# Patient Record
Sex: Female | Born: 1955
Health system: Southern US, Community
[De-identification: ages and names within clinical notes are randomized; demographics above are authoritative.]

## PROBLEM LIST (undated history)

## (undated) DIAGNOSIS — R87619 Unspecified abnormal cytological findings in specimens from cervix uteri: Secondary | ICD-10-CM

## (undated) DIAGNOSIS — M8580 Other specified disorders of bone density and structure, unspecified site: Secondary | ICD-10-CM

## (undated) DIAGNOSIS — B009 Herpesviral infection, unspecified: Secondary | ICD-10-CM

## (undated) DIAGNOSIS — E039 Hypothyroidism, unspecified: Secondary | ICD-10-CM

## (undated) HISTORY — DX: Herpesviral infection, unspecified: B00.9

## (undated) HISTORY — DX: Unspecified abnormal cytological findings in specimens from cervix uteri: R87.619

## (undated) HISTORY — PX: APPENDECTOMY: SHX54

## (undated) HISTORY — DX: Other specified disorders of bone density and structure, unspecified site: M85.80

## (undated) HISTORY — DX: Hypothyroidism, unspecified: E03.9

## (undated) HISTORY — PX: GYNECOLOGIC CRYOSURGERY: SHX857

---

## 1998-03-29 ENCOUNTER — Other Ambulatory Visit: Admission: RE | Admit: 1998-03-29 | Discharge: 1998-03-29 | Payer: Self-pay | Admitting: Internal Medicine

## 1999-11-23 ENCOUNTER — Encounter: Payer: Self-pay | Admitting: Internal Medicine

## 1999-11-23 ENCOUNTER — Encounter: Admission: RE | Admit: 1999-11-23 | Discharge: 1999-11-23 | Payer: Self-pay | Admitting: Internal Medicine

## 2000-04-11 ENCOUNTER — Other Ambulatory Visit: Admission: RE | Admit: 2000-04-11 | Discharge: 2000-04-11 | Payer: Self-pay | Admitting: Obstetrics and Gynecology

## 2000-11-29 ENCOUNTER — Encounter: Admission: RE | Admit: 2000-11-29 | Discharge: 2000-11-29 | Payer: Self-pay | Admitting: Internal Medicine

## 2000-11-29 ENCOUNTER — Encounter: Payer: Self-pay | Admitting: Internal Medicine

## 2001-04-14 ENCOUNTER — Other Ambulatory Visit: Admission: RE | Admit: 2001-04-14 | Discharge: 2001-04-14 | Payer: Self-pay | Admitting: Obstetrics and Gynecology

## 2001-12-24 ENCOUNTER — Encounter: Payer: Self-pay | Admitting: Internal Medicine

## 2001-12-24 ENCOUNTER — Encounter: Admission: RE | Admit: 2001-12-24 | Discharge: 2001-12-24 | Payer: Self-pay | Admitting: Internal Medicine

## 2002-04-16 ENCOUNTER — Other Ambulatory Visit: Admission: RE | Admit: 2002-04-16 | Discharge: 2002-04-16 | Payer: Self-pay | Admitting: Obstetrics and Gynecology

## 2003-01-08 ENCOUNTER — Encounter: Admission: RE | Admit: 2003-01-08 | Discharge: 2003-01-08 | Payer: Self-pay | Admitting: Internal Medicine

## 2003-01-08 ENCOUNTER — Encounter: Payer: Self-pay | Admitting: Internal Medicine

## 2003-04-26 ENCOUNTER — Other Ambulatory Visit: Admission: RE | Admit: 2003-04-26 | Discharge: 2003-04-26 | Payer: Self-pay | Admitting: Obstetrics and Gynecology

## 2004-01-14 ENCOUNTER — Encounter: Admission: RE | Admit: 2004-01-14 | Discharge: 2004-01-14 | Payer: Self-pay | Admitting: Internal Medicine

## 2004-05-19 ENCOUNTER — Other Ambulatory Visit: Admission: RE | Admit: 2004-05-19 | Discharge: 2004-05-19 | Payer: Self-pay | Admitting: Obstetrics and Gynecology

## 2005-02-22 ENCOUNTER — Encounter: Admission: RE | Admit: 2005-02-22 | Discharge: 2005-02-22 | Payer: Self-pay | Admitting: Internal Medicine

## 2005-06-25 ENCOUNTER — Other Ambulatory Visit: Admission: RE | Admit: 2005-06-25 | Discharge: 2005-06-25 | Payer: Self-pay | Admitting: Obstetrics and Gynecology

## 2006-03-20 ENCOUNTER — Encounter: Admission: RE | Admit: 2006-03-20 | Discharge: 2006-03-20 | Payer: Self-pay | Admitting: Internal Medicine

## 2006-06-26 ENCOUNTER — Other Ambulatory Visit: Admission: RE | Admit: 2006-06-26 | Discharge: 2006-06-26 | Payer: Self-pay | Admitting: Obstetrics and Gynecology

## 2007-04-16 ENCOUNTER — Encounter: Admission: RE | Admit: 2007-04-16 | Discharge: 2007-04-16 | Payer: Self-pay | Admitting: Internal Medicine

## 2007-07-01 ENCOUNTER — Other Ambulatory Visit: Admission: RE | Admit: 2007-07-01 | Discharge: 2007-07-01 | Payer: Self-pay | Admitting: Obstetrics and Gynecology

## 2008-04-29 ENCOUNTER — Encounter: Admission: RE | Admit: 2008-04-29 | Discharge: 2008-04-29 | Payer: Self-pay | Admitting: Internal Medicine

## 2008-07-26 ENCOUNTER — Other Ambulatory Visit: Admission: RE | Admit: 2008-07-26 | Discharge: 2008-07-26 | Payer: Self-pay | Admitting: Obstetrics and Gynecology

## 2008-07-30 ENCOUNTER — Ambulatory Visit: Payer: Self-pay | Admitting: Internal Medicine

## 2009-05-03 ENCOUNTER — Encounter: Admission: RE | Admit: 2009-05-03 | Discharge: 2009-05-03 | Payer: Self-pay | Admitting: Internal Medicine

## 2009-06-29 DIAGNOSIS — M899 Disorder of bone, unspecified: Secondary | ICD-10-CM | POA: Insufficient documentation

## 2009-07-07 ENCOUNTER — Ambulatory Visit: Payer: Self-pay | Admitting: Internal Medicine

## 2009-09-16 DIAGNOSIS — E042 Nontoxic multinodular goiter: Secondary | ICD-10-CM | POA: Insufficient documentation

## 2010-05-04 ENCOUNTER — Encounter: Admission: RE | Admit: 2010-05-04 | Discharge: 2010-05-04 | Payer: Self-pay | Admitting: Endocrinology

## 2011-04-17 ENCOUNTER — Other Ambulatory Visit: Payer: Self-pay | Admitting: Endocrinology

## 2011-04-17 DIAGNOSIS — Z1231 Encounter for screening mammogram for malignant neoplasm of breast: Secondary | ICD-10-CM

## 2011-05-11 ENCOUNTER — Ambulatory Visit
Admission: RE | Admit: 2011-05-11 | Discharge: 2011-05-11 | Disposition: A | Discharge status: Home / Self Care | Payer: PRIVATE HEALTH INSURANCE | Source: Ambulatory Visit | Attending: Endocrinology | Admitting: Endocrinology

## 2011-05-11 DIAGNOSIS — Z1231 Encounter for screening mammogram for malignant neoplasm of breast: Secondary | ICD-10-CM

## 2012-04-17 ENCOUNTER — Other Ambulatory Visit: Payer: Self-pay | Admitting: Endocrinology

## 2012-04-17 DIAGNOSIS — Z1231 Encounter for screening mammogram for malignant neoplasm of breast: Secondary | ICD-10-CM

## 2012-05-16 ENCOUNTER — Ambulatory Visit
Admission: RE | Admit: 2012-05-16 | Discharge: 2012-05-16 | Disposition: A | Discharge status: Home / Self Care | Payer: 59 | Source: Ambulatory Visit | Attending: Endocrinology | Admitting: Endocrinology

## 2012-05-16 DIAGNOSIS — Z1231 Encounter for screening mammogram for malignant neoplasm of breast: Secondary | ICD-10-CM

## 2013-04-17 ENCOUNTER — Other Ambulatory Visit: Payer: Self-pay

## 2013-04-17 DIAGNOSIS — Z1231 Encounter for screening mammogram for malignant neoplasm of breast: Secondary | ICD-10-CM

## 2013-06-03 ENCOUNTER — Ambulatory Visit
Admission: RE | Admit: 2013-06-03 | Discharge: 2013-06-03 | Disposition: A | Discharge status: Home / Self Care | Payer: 59 | Source: Ambulatory Visit

## 2013-06-03 DIAGNOSIS — Z1231 Encounter for screening mammogram for malignant neoplasm of breast: Secondary | ICD-10-CM

## 2013-09-07 ENCOUNTER — Encounter: Payer: Self-pay | Admitting: Nurse Practitioner

## 2013-09-07 ENCOUNTER — Ambulatory Visit (INDEPENDENT_AMBULATORY_CARE_PROVIDER_SITE_OTHER): Payer: 59 | Admitting: Nurse Practitioner

## 2013-09-07 ENCOUNTER — Encounter: Payer: Self-pay | Admitting: Obstetrics and Gynecology

## 2013-09-07 VITALS — BP 120/74 | HR 76 | Resp 16 | Ht 63.5 in | Wt 122.0 lb

## 2013-09-07 DIAGNOSIS — M899 Disorder of bone, unspecified: Secondary | ICD-10-CM

## 2013-09-07 DIAGNOSIS — M8580 Other specified disorders of bone density and structure, unspecified site: Secondary | ICD-10-CM

## 2013-09-07 DIAGNOSIS — Z01419 Encounter for gynecological examination (general) (routine) without abnormal findings: Secondary | ICD-10-CM

## 2013-09-07 DIAGNOSIS — Z0000 Encounter for general adult medical examination without abnormal findings: Secondary | ICD-10-CM

## 2013-09-07 LAB — POCT URINALYSIS DIPSTICK
Bilirubin, UA: NEGATIVE
Glucose, UA: NEGATIVE
pH, UA: 7

## 2013-09-07 MED ORDER — ESTRADIOL 0.5 MG PO TABS: 0.5000 mg | ORAL_TABLET | Freq: Every day | ORAL | Status: DC

## 2013-09-07 MED ORDER — MEDROXYPROGESTERONE ACETATE 2.5 MG PO TABS: 2.5000 mg | ORAL_TABLET | Freq: Every day | ORAL | Status: DC

## 2013-09-07 NOTE — Progress Notes (Signed)
Patient ID: Shelley Drake, female   DOB: 24-Apr-1956, 57 y.o.   MRN: [id number] 57 y.o. G0P0 Divorced Caucasian Fe here for annual exam. Still wants to continue with HRT.  Same partner for over 20 years.  He has been quite ill this past several years.    No LMP recorded. Patient is postmenopausal.          Sexually active: yes  The current method of family planning is condoms all of the time.    Exercising: yes  walking, biking, aerobics.  Does some sort of exercise at least 5 days per week. Smoker:  no  Health Maintenance: Pap: 09/03/12, WNL, neg HR HPV MMG: 06/03/13, Bi-Rads 1: negative Colonoscopy: 2006, repeat in 10 years  BMD: 08/2011;  T score: -0.4/spine only TDaP: 07/2009 Labs: HB: 13.9 Urine: negative   reports that she has never smoked. She has never used smokeless tobacco. She reports that she drinks alcohol. She reports that she does not use illicit drugs.  Past Medical History  Diagnosis Date  . Osteopenia   . HSV infection   . Hypothyroid age 27    Past Surgical History  Procedure Laterality Date  . Appendectomy  age 63    Current Outpatient Prescriptions  Medication Sig Dispense Refill  . estradiol (ESTRACE) 0.5 MG tablet Take 1 tablet (0.5 mg total) by mouth daily.  90 tablet  3  . levothyroxine (SYNTHROID, LEVOTHROID) 137 MCG tablet Take 137 mcg by mouth daily before breakfast.      . medroxyPROGESTERone (PROVERA) 2.5 MG tablet Take 1 tablet (2.5 mg total) by mouth daily.  90 tablet  3  . Multiple Vitamin (MULTIVITAMIN) tablet Take 1 tablet by mouth daily.       No current facility-administered medications for this visit.    Family History  Problem Relation Age of Onset  . Ovarian cancer Mother 49  . Esophageal cancer Father 1    ROS:  Pertinent items are noted in HPI.  Otherwise, a comprehensive ROS was negative.  Exam:   BP 120/74  Pulse 76  Resp 16  Ht 5' 3.5" (1.613 m)  Wt 122 lb (55.339 kg)  BMI 21.27 kg/m2 Height: 5' 3.5" (161.3 cm)  Ht  Readings from Last 3 Encounters:  09/07/13 5' 3.5" (1.613 m)    General appearance: alert, cooperative and appears stated age Head: Normocephalic, without obvious abnormality, atraumatic Neck: no adenopathy, supple, symmetrical, trachea midline and thyroid normal to inspection and palpation Lungs: clear to auscultation bilaterally Breasts: normal appearance, no masses or tenderness Heart: regular rate and rhythm Abdomen: soft, non-tender; no masses,  no organomegaly Extremities: extremities normal, atraumatic, no cyanosis or edema Skin: Skin color, texture, turgor normal. No rashes or lesions Lymph nodes: Cervical, supraclavicular, and axillary nodes normal. No abnormal inguinal nodes palpated Neurologic: Grossly normal   Pelvic: External genitalia:  no lesions              Urethra:  normal appearing urethra with no masses, tenderness or lesions              Bartholin's and Skene's: normal                 Vagina: normal appearing vagina with normal color and discharge, no lesions              Cervix: anteverted              Pap taken: no Bimanual Exam:  Uterus:  normal size, contour, position,  consistency, mobility, non-tender              Adnexa: no mass, fullness, tenderness               Rectovaginal: Confirms               Anus:  normal sphincter tone, no lesions  A:  Well Woman with normal exam  Postmenopausal on HRT since 10/08  History of Osteopenia diagnosed 1997 - BMD normal since  history of Hypothyroid - followed by PCP  P:   Pap smear as per guidelines not done  Mammo is due 05/2014  Refill on provera 2.5 mg daily for a year  Refill on Estradiol 0.5 mg daily for a year  Reviewed potential side effects and risk of HRT including CVA, DVT, cancer, etc Will continue to follow with Mammo, diet , exercise, calcium, Vit D  return annually or prn  An After Visit Summary was printed and given to the patient.

## 2013-09-07 NOTE — Patient Instructions (Signed)

## 2013-09-08 ENCOUNTER — Encounter: Payer: Self-pay | Admitting: Nurse Practitioner

## 2013-09-09 NOTE — Progress Notes (Signed)
Encounter reviewed by Dr. Takiya Belmares Silva.  

## 2014-05-07 ENCOUNTER — Other Ambulatory Visit: Payer: Self-pay

## 2014-05-07 DIAGNOSIS — Z1231 Encounter for screening mammogram for malignant neoplasm of breast: Secondary | ICD-10-CM

## 2014-06-04 ENCOUNTER — Ambulatory Visit
Admission: RE | Admit: 2014-06-04 | Discharge: 2014-06-04 | Disposition: A | Discharge status: Home / Self Care | Payer: 59 | Source: Ambulatory Visit

## 2014-06-04 DIAGNOSIS — Z1231 Encounter for screening mammogram for malignant neoplasm of breast: Secondary | ICD-10-CM

## 2014-09-01 ENCOUNTER — Other Ambulatory Visit: Payer: Self-pay

## 2014-09-01 NOTE — Telephone Encounter (Signed)
Incoming Refill Request from [facility] JO:[id number] 2.5mg   Last AEX:09/07/13 Last Refill:09/07/13 #90 X 3 Next AEX: 09/08/14 Last MMG:06/04/14 Bi-Rads Neg   Pt has an AEX on 09/08/14. Would you like to wait till appt before refills?

## 2014-09-02 ENCOUNTER — Encounter: Payer: Self-pay | Admitting: Nurse Practitioner

## 2014-09-02 MED ORDER — MEDROXYPROGESTERONE ACETATE 2.5 MG PO TABS: 2.5000 mg | ORAL_TABLET | Freq: Every day | ORAL | Status: DC

## 2014-09-02 NOTE — Telephone Encounter (Addendum)
90 day supply sent in by Regina Eck, CNM  Routing to provider for final review

## 2014-09-02 NOTE — Telephone Encounter (Signed)
Patient sent mychart message to Milford Cage, FNP today regarding refill of medication. Please review message from patient below and advise.  From  Shelley Drake   To  Milford Cage, FNP   Sent  09/02/2014 10:12 AM      I do not have enough medroxyprogesterone 2.5mg  to last to my appointment next Wednesday 09/08/14 at 3:45. The pharmacy at [facility] on [address] says they contacted your office on Tuesday. I will be out of Medicine by Sunday.Hope you get this in time

## 2014-09-02 NOTE — Telephone Encounter (Signed)
Regina Eck CNM refilled medication for Provera 2.5 mg #90 0RF. See refill encounter. Mychart message sent to patient to inform of refill.   Routing to provider for final review. Patient agreeable to disposition. Will close encounter

## 2014-09-02 NOTE — Telephone Encounter (Signed)
Hold until appt. Unless she is totally out

## 2014-09-08 ENCOUNTER — Ambulatory Visit (INDEPENDENT_AMBULATORY_CARE_PROVIDER_SITE_OTHER): Payer: 59 | Admitting: Nurse Practitioner

## 2014-09-08 ENCOUNTER — Encounter: Payer: Self-pay | Admitting: Nurse Practitioner

## 2014-09-08 VITALS — BP 110/72 | HR 84 | Ht 64.0 in | Wt 121.0 lb

## 2014-09-08 DIAGNOSIS — Z0000 Encounter for general adult medical examination without abnormal findings: Secondary | ICD-10-CM

## 2014-09-08 DIAGNOSIS — Z1211 Encounter for screening for malignant neoplasm of colon: Secondary | ICD-10-CM

## 2014-09-08 DIAGNOSIS — Z01419 Encounter for gynecological examination (general) (routine) without abnormal findings: Secondary | ICD-10-CM

## 2014-09-08 DIAGNOSIS — R319 Hematuria, unspecified: Secondary | ICD-10-CM

## 2014-09-08 LAB — POCT URINALYSIS DIPSTICK
BILIRUBIN UA: NEGATIVE
GLUCOSE UA: NEGATIVE
Ketones, UA: NEGATIVE
LEUKOCYTES UA: NEGATIVE
NITRITE UA: NEGATIVE
PROTEIN UA: NEGATIVE
UROBILINOGEN UA: NEGATIVE
pH, UA: 5

## 2014-09-08 LAB — HEMOGLOBIN, FINGERSTICK: HEMOGLOBIN, FINGERSTICK: 13.6 g/dL (ref 12.0–16.0)

## 2014-09-08 MED ORDER — MEDROXYPROGESTERONE ACETATE 2.5 MG PO TABS: 2.5000 mg | ORAL_TABLET | Freq: Every day | ORAL | Status: DC

## 2014-09-08 MED ORDER — ESTRADIOL 0.5 MG PO TABS: 0.5000 mg | ORAL_TABLET | Freq: Every day | ORAL | Status: DC

## 2014-09-08 NOTE — Patient Instructions (Signed)

## 2014-09-08 NOTE — Progress Notes (Signed)
Patient ID: Shelley Drake, female   DOB: Sep 15, 1956, 58 y.o.   MRN: [id number] 58 y.o. G0P0 Divorced Caucasian Fe here for annual exam. No vaso symptoms. wants to continue HRT. Partner of 20 years has passed in 01-23-23.  She has been sad but coping with things now.  He had cancer for 6 years.  Patient's last menstrual period was 01/23/2007.          Sexually active: no The current method of family planning is none. Exercising: yes walking, jazzercise. Does some sort of exercise at least 5 days per week. Smoker: no  Health Maintenance: Pap: 09/03/12, WNL, neg HR HPV MMG: 06/04/14, 3D, Bi-Rads 1: negative Colonoscopy: 2006, repeat in 10 years  BMD: 08/2011; T score: -0.4/spine only TDaP: 07/2009 Shingles: no Prevnar: no Labs:  HB:  13.6  Urine:  Small RBC   reports that she has never smoked. She has never used smokeless tobacco. She reports that she drinks alcohol. She reports that she does not use illicit drugs.  Past Medical History  Diagnosis Date  . Osteopenia   . HSV infection   . Hypothyroid age 43    Past Surgical History  Procedure Laterality Date  . Appendectomy  age 56    Current Outpatient Prescriptions  Medication Sig Dispense Refill  . calcium-vitamin D 250-100 MG-UNIT per tablet Take 1 tablet by mouth daily.    Marland Kitchen estradiol (ESTRACE) 0.5 MG tablet Take 1 tablet (0.5 mg total) by mouth daily. 90 tablet 3  . levothyroxine (SYNTHROID, LEVOTHROID) 137 MCG tablet Take 137 mcg by mouth daily before breakfast.    . medroxyPROGESTERone (PROVERA) 2.5 MG tablet Take 1 tablet (2.5 mg total) by mouth daily. 90 tablet 3  . Multiple Vitamin (MULTIVITAMIN) tablet Take 1 tablet by mouth daily.     No current facility-administered medications for this visit.    Family History  Problem Relation Age of Onset  . Ovarian cancer Mother 69  . Esophageal cancer Father 74    ROS:  Pertinent items are noted in HPI.  Otherwise, a comprehensive ROS was negative.  Exam:   BP  110/72 mmHg  Pulse 84  Ht 5\' 4"  (1.626 m)  Wt 121 lb (54.885 kg)  BMI 20.76 kg/m2  LMP 01/23/2007 Height: 5\' 4"  (162.6 cm)  Ht Readings from Last 3 Encounters:  09/08/14 5\' 4"  (1.626 m)  09/07/13 5' 3.5" (1.613 m)    General appearance: alert, cooperative and appears stated age Head: Normocephalic, without obvious abnormality, atraumatic Neck: no adenopathy, supple, symmetrical, trachea midline and thyroid normal to inspection and palpation Lungs: clear to auscultation bilaterally Breasts: normal appearance, no masses or tenderness Heart: regular rate and rhythm Abdomen: soft, non-tender; no masses,  no organomegaly Extremities: extremities normal, atraumatic, no cyanosis or edema Skin: Skin color, texture, turgor normal. No rashes or lesions Lymph nodes: Cervical, supraclavicular, and axillary nodes normal. No abnormal inguinal nodes palpated Neurologic: Grossly normal   Pelvic: External genitalia:  no lesions              Urethra:  normal appearing urethra with no masses, tenderness or lesions              Bartholin's and Skene's: normal                 Vagina: normal appearing vagina with normal color and discharge, no lesions              Cervix: anteverted  Pap taken: No. Bimanual Exam:  Uterus:  normal size, contour, position, consistency, mobility, non-tender              Adnexa: no mass, fullness, tenderness               Rectovaginal: Confirms               Anus:  normal sphincter tone, no lesions  A:  Well Woman with normal exam  Postmenopausal on HRT since 10/08 History of Osteopenia diagnosed 1997 - BMD normal since History of Hypothyroid - followed by PCP  Grief reaction  hematuria  P:   Reviewed health and wellness pertinent to exam  Pap smear taken today  Mammogram is due 8/16  Follow with labs and urine  Refill on Estrace and Provera for a year.  Counseled with risk of DVT, CVA, cancer, etc.  Counseled on breast self  exam, mammography screening, HRT,adequate intake of calcium and vitamin D, diet and exercise return annually or prn  An After Visit Summary was printed and given to the patient.

## 2014-09-09 LAB — CBC WITH DIFFERENTIAL/PLATELET
BASOS ABS: 0.1 10*3/uL (ref 0.0–0.1)
BASOS PCT: 1 % (ref 0–1)
EOS ABS: 0.2 10*3/uL (ref 0.0–0.7)
Eosinophils Relative: 2 % (ref 0–5)
HCT: 41.1 % (ref 36.0–46.0)
Hemoglobin: 13.9 g/dL (ref 12.0–15.0)
LYMPHS PCT: 32 % (ref 12–46)
Lymphs Abs: 2.5 10*3/uL (ref 0.7–4.0)
MCH: 31.2 pg (ref 26.0–34.0)
MCHC: 33.8 g/dL (ref 30.0–36.0)
MCV: 92.2 fL (ref 78.0–100.0)
MONO ABS: 1 10*3/uL (ref 0.1–1.0)
Monocytes Relative: 13 % — ABNORMAL HIGH (ref 3–12)
NEUTROS PCT: 52 % (ref 43–77)
Neutro Abs: 4.1 10*3/uL (ref 1.7–7.7)
Platelets: 254 10*3/uL (ref 150–400)
RBC: 4.46 MIL/uL (ref 3.87–5.11)
RDW: 13.1 % (ref 11.5–15.5)
WBC: 7.9 10*3/uL (ref 4.0–10.5)

## 2014-09-09 LAB — COMPREHENSIVE METABOLIC PANEL
ALT: 12 U/L (ref 0–35)
AST: 15 U/L (ref 0–37)
Albumin: 4.2 g/dL (ref 3.5–5.2)
Alkaline Phosphatase: 52 U/L (ref 39–117)
BUN: 17 mg/dL (ref 6–23)
CO2: 28 mEq/L (ref 19–32)
Calcium: 9.4 mg/dL (ref 8.4–10.5)
Chloride: 103 mEq/L (ref 96–112)
Creat: 0.77 mg/dL (ref 0.50–1.10)
GLUCOSE: 94 mg/dL (ref 70–99)
POTASSIUM: 4.1 meq/L (ref 3.5–5.3)
Sodium: 139 mEq/L (ref 135–145)
Total Bilirubin: 0.3 mg/dL (ref 0.2–1.2)
Total Protein: 6.8 g/dL (ref 6.0–8.3)

## 2014-09-09 LAB — LIPID PANEL
Cholesterol: 161 mg/dL (ref 0–200)
HDL: 81 mg/dL (ref >39)
LDL Cholesterol: 65 mg/dL (ref 0–99)
TRIGLYCERIDES: 73 mg/dL (ref <150)
Total CHOL/HDL Ratio: 2 Ratio
VLDL: 15 mg/dL (ref 0–40)

## 2014-09-09 LAB — URINALYSIS, MICROSCOPIC ONLY
BACTERIA UA: NONE SEEN
Casts: NONE SEEN
Crystals: NONE SEEN
SQUAMOUS EPITHELIAL / LPF: NONE SEEN

## 2014-09-09 LAB — VITAMIN D 25 HYDROXY (VIT D DEFICIENCY, FRACTURES): Vit D, 25-Hydroxy: 40 ng/mL (ref 30–89)

## 2014-09-09 NOTE — Progress Notes (Signed)
Encounter reviewed by Dr. Brook Silva.  

## 2014-09-10 LAB — URINE CULTURE
COLONY COUNT: NO GROWTH
Organism ID, Bacteria: NO GROWTH

## 2014-09-30 LAB — FECAL OCCULT BLOOD, IMMUNOCHEMICAL: IMMUNOLOGICAL FECAL OCCULT BLOOD TEST: NEGATIVE

## 2014-09-30 NOTE — Addendum Note (Signed)
Addended by: Graylon Good on: 09/30/2014 04:19 PM   Modules accepted: Orders

## 2015-05-24 ENCOUNTER — Other Ambulatory Visit: Payer: Self-pay

## 2015-05-24 DIAGNOSIS — Z1231 Encounter for screening mammogram for malignant neoplasm of breast: Secondary | ICD-10-CM

## 2015-07-01 ENCOUNTER — Ambulatory Visit
Admission: RE | Admit: 2015-07-01 | Discharge: 2015-07-01 | Disposition: A | Discharge status: Home / Self Care | Payer: 59 | Source: Ambulatory Visit

## 2015-07-01 DIAGNOSIS — Z1231 Encounter for screening mammogram for malignant neoplasm of breast: Secondary | ICD-10-CM

## 2015-08-26 LAB — HM COLONOSCOPY: HM COLON: NORMAL

## 2015-08-29 ENCOUNTER — Other Ambulatory Visit: Payer: Self-pay | Admitting: Nurse Practitioner

## 2015-08-29 NOTE — Telephone Encounter (Signed)
Medication refill request:estradiol  Last AEX:  09-08-14 Next AEX: 09-13-15 Last MMG (if hormonal medication request):07-06-15 WNL Refill authorized: please advise

## 2015-09-13 ENCOUNTER — Ambulatory Visit (INDEPENDENT_AMBULATORY_CARE_PROVIDER_SITE_OTHER): Payer: 59 | Admitting: Nurse Practitioner

## 2015-09-13 ENCOUNTER — Encounter: Payer: Self-pay | Admitting: Nurse Practitioner

## 2015-09-13 VITALS — BP 116/80 | HR 76 | Ht 63.75 in | Wt 124.0 lb

## 2015-09-13 DIAGNOSIS — Z0000 Encounter for general adult medical examination without abnormal findings: Secondary | ICD-10-CM | POA: Diagnosis not present

## 2015-09-13 DIAGNOSIS — Z01419 Encounter for gynecological examination (general) (routine) without abnormal findings: Secondary | ICD-10-CM | POA: Diagnosis not present

## 2015-09-13 LAB — POCT URINALYSIS DIPSTICK
BILIRUBIN UA: NEGATIVE
GLUCOSE UA: NEGATIVE
Ketones, UA: NEGATIVE
LEUKOCYTES UA: NEGATIVE
NITRITE UA: NEGATIVE
PH UA: 5
Protein, UA: NEGATIVE
RBC UA: NEGATIVE
UROBILINOGEN UA: NEGATIVE

## 2015-09-13 LAB — HEPATITIS C ANTIBODY: HCV Ab: NEGATIVE

## 2015-09-13 MED ORDER — MEDROXYPROGESTERONE ACETATE 2.5 MG PO TABS: 2.5000 mg | ORAL_TABLET | Freq: Every day | ORAL | Status: DC

## 2015-09-13 MED ORDER — ESTRADIOL 0.5 MG PO TABS: ORAL_TABLET | ORAL | Status: DC

## 2015-09-13 NOTE — Progress Notes (Signed)
Patient ID: Shelley Drake, female   DOB: [birth year], 59 y.o.   MRN: [id number] 59 y.o. G0P0 Divorced  Caucasian Fe here for annual exam.  Feels well no new diagnosis.  She is now dating a new partner for about 8-10 months.  Previous partner of 20 years died 2 years ago.  Patient's last menstrual period was 12/28/2006.          Sexually active: Yes.    The current method of family planning is post menopausal status.    Exercising: Yes.    walking at least two times per week, swimming in the summer Smoker:  no  Health Maintenance: Pap: 09/03/12, WNL, neg HR HPV MMG: 07/01/15, Bi-Rads 1: Negative  Colonoscopy: 07/2015, normal, repeat in 10 years, Dr. Penelope Coop BMD: 08/2011; -0.4/spine only TDaP: 08/02/2009 Labs: at PCP Urine: negative   reports that she has never smoked. She has never used smokeless tobacco. She reports that she drinks alcohol. She reports that she does not use illicit drugs.  Past Medical History  Diagnosis Date  . Osteopenia   . HSV infection   . Hypothyroid age 50    Past Surgical History  Procedure Laterality Date  . Appendectomy  age 81    Current Outpatient Prescriptions  Medication Sig Dispense Refill  . calcium-vitamin D 250-100 MG-UNIT per tablet Take 1 tablet by mouth daily.    Marland Kitchen estradiol (ESTRACE) 0.5 MG tablet TAKE 1 TABLET (0.5 MG TOTAL) BY MOUTH DAILY. 90 tablet 4  . levothyroxine (SYNTHROID, LEVOTHROID) 137 MCG tablet Take 137 mcg by mouth daily before breakfast.    . medroxyPROGESTERone (PROVERA) 2.5 MG tablet Take 1 tablet (2.5 mg total) by mouth daily. 90 tablet 4  . Multiple Vitamin (MULTIVITAMIN) tablet Take 1 tablet by mouth daily.     No current facility-administered medications for this visit.    Family History  Problem Relation Age of Onset  . Ovarian cancer Mother 65  . Esophageal cancer Father 41    ROS:  Pertinent items are noted in HPI.  Otherwise, a comprehensive ROS was negative.  Exam:   BP 116/80 mmHg  Pulse 76  Ht 5'  3.75" (1.619 m)  Wt 124 lb (56.246 kg)  BMI 21.46 kg/m2  LMP 12/28/2006 Height: 5' 3.75" (161.9 cm) Ht Readings from Last 3 Encounters:  09/13/15 5' 3.75" (1.619 m)  09/08/14 5\' 4"  (1.626 m)  09/07/13 5' 3.5" (1.613 m)    General appearance: alert, cooperative and appears stated age Head: Normocephalic, without obvious abnormality, atraumatic Neck: no adenopathy, supple, symmetrical, trachea midline and thyroid normal to inspection and palpation Lungs: clear to auscultation bilaterally Breasts: normal appearance, no masses or tenderness Heart: regular rate and rhythm Abdomen: soft, non-tender; no masses,  no organomegaly Extremities: extremities normal, atraumatic, no cyanosis or edema Skin: Skin color, texture, turgor normal. No rashes or lesions Lymph nodes: Cervical, supraclavicular, and axillary nodes normal. No abnormal inguinal nodes palpated Neurologic: Grossly normal   Pelvic: External genitalia:  no lesions              Urethra:  normal appearing urethra with no masses, tenderness or lesions              Bartholin's and Skene's: normal                 Vagina: normal appearing vagina with normal color and discharge, no lesions              Cervix: anteverted  Pap taken: Yes.   Bimanual Exam:  Uterus:  normal size, contour, position, consistency, mobility, non-tender              Adnexa: no mass, fullness, tenderness               Rectovaginal: Confirms               Anus:  normal sphincter tone, no lesions  Chaperone present: yes  A:  Well Woman with normal exam  Postmenopausal on HRT since 10/08 History of Osteopenia diagnosed 1997 - BMD normal since History of Hypothyroid - followed by PCP    P:   Reviewed health and wellness pertinent to exam  Pap smear as above  Mammogram is due 06/2016  Refill on Estrace and Provera for a year  Counseled with risk of DVT, CVA, cancer  Will update health maintenance  Counseled  on breast self exam, mammography screening, use and side effects of HRT, adequate intake of calcium and vitamin D, diet and exercise return annually or prn  An After Visit Summary was printed and given to the patient.

## 2015-09-13 NOTE — Patient Instructions (Signed)

## 2015-09-14 LAB — HIV ANTIBODY (ROUTINE TESTING W REFLEX): HIV: NONREACTIVE

## 2015-09-18 NOTE — Progress Notes (Signed)
Encounter reviewed by Dr. Brook Amundson C. Silva.  

## 2015-09-19 LAB — IPS PAP TEST WITH HPV

## 2015-11-18 MED FILL — ESTRADIOL 0.5 MG TABLET: 0.5 | 90 days supply | Qty: 90 | Fill #0

## 2015-11-18 MED FILL — MEDROXYPROGESTERONE 2.5 MG: 2.5 | 90 days supply | Qty: 90 | Fill #0

## 2015-11-21 DIAGNOSIS — Z6821 Body mass index (BMI) 21.0-21.9, adult: Secondary | ICD-10-CM | POA: Diagnosis not present

## 2015-11-21 DIAGNOSIS — Z1389 Encounter for screening for other disorder: Secondary | ICD-10-CM | POA: Diagnosis not present

## 2015-11-21 DIAGNOSIS — E042 Nontoxic multinodular goiter: Secondary | ICD-10-CM | POA: Diagnosis not present

## 2015-11-21 DIAGNOSIS — M859 Disorder of bone density and structure, unspecified: Secondary | ICD-10-CM | POA: Diagnosis not present

## 2015-11-24 DIAGNOSIS — H524 Presbyopia: Secondary | ICD-10-CM | POA: Diagnosis not present

## 2015-12-09 DIAGNOSIS — L570 Actinic keratosis: Secondary | ICD-10-CM | POA: Diagnosis not present

## 2016-01-30 MED FILL — LEVOTHYROXINE 137 MCG TAB: 137 | 90 days supply | Qty: 90 | Fill #0

## 2016-02-28 MED FILL — MEDROXYPROGESTERONE 2.5 MG: 2.5 | 90 days supply | Qty: 90 | Fill #1

## 2016-02-28 MED FILL — ESTRADIOL 0.5 MG TABLET: 0.5 | 90 days supply | Qty: 90 | Fill #1

## 2016-05-08 MED FILL — LEVOTHYROXINE 137 MCG TAB: 137 | 90 days supply | Qty: 90 | Fill #1

## 2016-05-28 MED FILL — ESTRADIOL 0.5 MG TABLET: 0.5 | 90 days supply | Qty: 90 | Fill #2

## 2016-05-28 MED FILL — MEDROXYPROGESTERONE 2.5 MG: 2.5 | 90 days supply | Qty: 90 | Fill #2

## 2016-06-06 ENCOUNTER — Other Ambulatory Visit: Payer: Self-pay | Admitting: Endocrinology

## 2016-06-06 DIAGNOSIS — Z1231 Encounter for screening mammogram for malignant neoplasm of breast: Secondary | ICD-10-CM

## 2016-07-10 ENCOUNTER — Ambulatory Visit
Admission: RE | Admit: 2016-07-10 | Discharge: 2016-07-10 | Disposition: A | Discharge status: Home / Self Care | Payer: 59 | Source: Ambulatory Visit | Attending: Endocrinology | Admitting: Endocrinology

## 2016-07-10 DIAGNOSIS — Z1231 Encounter for screening mammogram for malignant neoplasm of breast: Secondary | ICD-10-CM

## 2016-08-21 MED FILL — LEVOTHYROXINE 137 MCG TAB: 137 | 90 days supply | Qty: 90 | Fill #2

## 2016-08-27 MED FILL — ESTRADIOL 0.5 MG TABLET: 0.5 | 90 days supply | Qty: 90 | Fill #3

## 2016-08-27 MED FILL — MEDROXYPROGESTERONE 2.5 MG: 2.5 | 90 days supply | Qty: 90 | Fill #3

## 2016-09-14 ENCOUNTER — Ambulatory Visit (INDEPENDENT_AMBULATORY_CARE_PROVIDER_SITE_OTHER): Payer: 59 | Admitting: Podiatry

## 2016-09-14 ENCOUNTER — Encounter: Payer: Self-pay | Admitting: Podiatry

## 2016-09-14 VITALS — BP 114/65 | HR 87

## 2016-09-14 DIAGNOSIS — M216X9 Other acquired deformities of unspecified foot: Secondary | ICD-10-CM

## 2016-09-14 DIAGNOSIS — Q828 Other specified congenital malformations of skin: Secondary | ICD-10-CM | POA: Diagnosis not present

## 2016-09-14 NOTE — Progress Notes (Signed)
   Subjective:    Patient ID: Shelley Drake, female    DOB: 14-May-1956, 60 y.o.   MRN: [id number]  HPI    Review of Systems  All other systems reviewed and are negative.      Objective:   Physical Exam        Assessment & Plan:

## 2016-09-17 NOTE — Progress Notes (Signed)
Subjective:     Patient ID: Shelley Drake, female   DOB: 11-20-1955, 60 y.o.   MRN: [id number]  HPI patient presents with painful lesion plantar aspect right that patient states is been present for a while and feels like she's walking on a more   Review of Systems  All other systems reviewed and are negative.      Objective:   Physical Exam  Constitutional: She is oriented to person, place, and time.  Cardiovascular: Intact distal pulses.   Musculoskeletal: Normal range of motion.  Neurological: She is oriented to person, place, and time.  Skin: Skin is warm.  Nursing note and vitals reviewed.  neurovascular status found to be intact muscle strength adequate range of motion within normal limits with patient found to have a keratotic lesion subsecond metatarsal right and distal to this area with patient noted to have a lucent core upon debridement and pain when palpated directly. Patient's found have good digital perfusion and well oriented 3     Assessment:     Probable porokeratotic type lesion with possibility for bone pressure    Plan:     H&P condition reviewed and debridement accomplished and applied medication to soften the lesion. We will see her back when symptomatic and this will dictate what else we may need to do with this particular condition

## 2016-09-19 ENCOUNTER — Ambulatory Visit (INDEPENDENT_AMBULATORY_CARE_PROVIDER_SITE_OTHER): Payer: 59 | Admitting: Nurse Practitioner

## 2016-09-19 ENCOUNTER — Encounter: Payer: Self-pay | Admitting: Nurse Practitioner

## 2016-09-19 VITALS — BP 110/70 | HR 76 | Ht 63.0 in | Wt 124.0 lb

## 2016-09-19 DIAGNOSIS — Z0000 Encounter for general adult medical examination without abnormal findings: Secondary | ICD-10-CM | POA: Diagnosis not present

## 2016-09-19 DIAGNOSIS — Z01419 Encounter for gynecological examination (general) (routine) without abnormal findings: Secondary | ICD-10-CM | POA: Diagnosis not present

## 2016-09-19 LAB — CBC
HCT: 40.5 % (ref 35.0–45.0)
Hemoglobin: 13.1 g/dL (ref 11.7–15.5)
MCH: 30.9 pg (ref 27.0–33.0)
MCHC: 32.3 g/dL (ref 32.0–36.0)
MCV: 95.5 fL (ref 80.0–100.0)
MPV: 11.2 fL (ref 7.5–12.5)
PLATELETS: 223 10*3/uL (ref 140–400)
RBC: 4.24 MIL/uL (ref 3.80–5.10)
RDW: 12.5 % (ref 11.0–15.0)
WBC: 7.6 10*3/uL (ref 3.8–10.8)

## 2016-09-19 LAB — POCT URINALYSIS DIPSTICK
Bilirubin, UA: NEGATIVE
Glucose, UA: NEGATIVE
Ketones, UA: NEGATIVE
Leukocytes, UA: NEGATIVE
NITRITE UA: NEGATIVE
PH UA: 5
Protein, UA: NEGATIVE
RBC UA: NEGATIVE
UROBILINOGEN UA: NEGATIVE

## 2016-09-19 LAB — TSH: TSH: 0.05 mIU/L — ABNORMAL LOW

## 2016-09-19 MED ORDER — ESTRADIOL 0.5 MG PO TABS: ORAL_TABLET | ORAL | 4 refills | Status: DC

## 2016-09-19 MED ORDER — MEDROXYPROGESTERONE ACETATE 2.5 MG PO TABS: 2.5000 mg | ORAL_TABLET | Freq: Every day | ORAL | 4 refills | Status: DC

## 2016-09-19 NOTE — Patient Instructions (Signed)

## 2016-09-19 NOTE — Progress Notes (Signed)
Patient ID: Shelley Drake, female   DOB: 28-May-1956, 60 y.o.   MRN: [id number]  60 y.o. G0P0000 Divorced  Caucasian Fe here for annual exam.  Right foot plantar Keratosis and treated by Podiatrist - Dr. Paulla Dolly.  No other new health problems.  Same partner now for 2 yrs.  Patient's last menstrual period was 12/28/2006.          Sexually active: Yes.    The current method of family planning is post menopausal status.    Exercising: Yes.    walking Smoker:  no  Health Maintenance: Pap: 09/13/15, Negative with neg HR HPV MMG: 07/10/16, 3D, Bi-Rads 1: Negative  Colonoscopy: 08/26/15, normal, repeat in 10 years, Dr. Penelope Coop BMD: 08/2011; -0.4/spine only Shingles: Never Pneumonia: Not indicated due to age TDaP: 08/02/2009 Hep C and HIV: 09/13/15 Labs: HB: 12.4  Urine: Negative   reports that she has never smoked. She has never used smokeless tobacco. She reports that she drinks alcohol. She reports that she does not use drugs.  Past Medical History:  Diagnosis Date  . HSV infection   . Hypothyroid age 57  . Osteopenia     Past Surgical History:  Procedure Laterality Date  . APPENDECTOMY  age 66    Current Outpatient Prescriptions  Medication Sig Dispense Refill  . calcium-vitamin D 250-100 MG-UNIT per tablet Take 1 tablet by mouth daily.    Marland Kitchen estradiol (ESTRACE) 0.5 MG tablet TAKE 1 TABLET (0.5 MG TOTAL) BY MOUTH DAILY. 90 tablet 4  . levothyroxine (SYNTHROID, LEVOTHROID) 137 MCG tablet Take 137 mcg by mouth daily before breakfast.    . medroxyPROGESTERone (PROVERA) 2.5 MG tablet Take 1 tablet (2.5 mg total) by mouth daily. 90 tablet 4  . Multiple Vitamin (MULTIVITAMIN) tablet Take 1 tablet by mouth daily.     No current facility-administered medications for this visit.     Family History  Problem Relation Age of Onset  . Ovarian cancer Mother 26  . Esophageal cancer Father 33    ROS:  Pertinent items are noted in HPI.  Otherwise, a comprehensive ROS was negative.  Exam:    LMP 12/28/2006    Ht Readings from Last 3 Encounters:  09/13/15 5' 3.75" (1.619 m)  09/08/14 5\' 4"  (1.626 m)  09/07/13 5' 3.5" (1.613 m)    General appearance: alert, cooperative and appears stated age Head: Normocephalic, without obvious abnormality, atraumatic Neck: no adenopathy, supple, symmetrical, trachea midline and thyroid normal to inspection and palpation Lungs: clear to auscultation bilaterally Breasts: normal appearance, no masses or tenderness Heart: regular rate and rhythm Abdomen: soft, non-tender; no masses,  no organomegaly Extremities: extremities normal, atraumatic, no cyanosis or edema Skin: Skin color, texture, turgor normal. No rashes or lesions Lymph nodes: Cervical, supraclavicular, and axillary nodes normal. No abnormal inguinal nodes palpated Neurologic: Grossly normal   Pelvic: External genitalia:  no lesions              Urethra:  normal appearing urethra with no masses, tenderness or lesions              Bartholin's and Skene's: normal                 Vagina: normal appearing vagina with normal color and discharge, no lesions              Cervix: anteverted              Pap taken: No. Bimanual Exam:  Uterus:  normal size, contour,  position, consistency, mobility, non-tender              Adnexa: no mass, fullness, tenderness               Rectovaginal: Confirms               Anus:  normal sphincter tone, no lesions  Chaperone present: yes  A:  Well Woman with normal exam  Postmenopausal on HRT since 10/08 History of Osteopenia diagnosed 1997 - BMD normal since History of Hypothyroid - followed by PCP   P:   Reviewed health and wellness pertinent to exam  Pap smear as above  Mammogram is due 06/2017  Refill on HRT for a year - will try tapering this next year  Counseled about risk of DVT, CVA, cancer, etc  Counseled on breast self exam, mammography screening, use and side effects of HRT, adequate intake of  calcium and vitamin D, diet and exercise return annually or prn  An After Visit Summary was printed and given to the patient.

## 2016-09-20 LAB — LIPID PANEL
CHOL/HDL RATIO: 1.8 ratio (ref <5.0)
CHOLESTEROL: 148 mg/dL (ref <200)
HDL: 81 mg/dL (ref >50)
LDL Cholesterol: 50 mg/dL (ref <100)
Triglycerides: 84 mg/dL (ref <150)
VLDL: 17 mg/dL (ref <30)

## 2016-09-20 LAB — COMPREHENSIVE METABOLIC PANEL
ALK PHOS: 46 U/L (ref 33–130)
ALT: 10 U/L (ref 6–29)
AST: 16 U/L (ref 10–35)
Albumin: 4 g/dL (ref 3.6–5.1)
BUN: 15 mg/dL (ref 7–25)
CO2: 27 mmol/L (ref 20–31)
CREATININE: 0.98 mg/dL (ref 0.50–0.99)
Calcium: 9 mg/dL (ref 8.6–10.4)
Chloride: 106 mmol/L (ref 98–110)
Glucose, Bld: 80 mg/dL (ref 65–99)
Potassium: 3.9 mmol/L (ref 3.5–5.3)
SODIUM: 142 mmol/L (ref 135–146)
TOTAL PROTEIN: 6.7 g/dL (ref 6.1–8.1)
Total Bilirubin: 0.3 mg/dL (ref 0.2–1.2)

## 2016-09-20 LAB — VITAMIN D 25 HYDROXY (VIT D DEFICIENCY, FRACTURES): VIT D 25 HYDROXY: 35 ng/mL (ref 30–100)

## 2016-09-20 NOTE — Progress Notes (Signed)
Reviewed personally.  M. Suzanne Seraphim Affinito, MD.  

## 2016-09-24 LAB — HEMOGLOBIN, FINGERSTICK: HEMOGLOBIN, FINGERSTICK: 12.4 g/dL (ref 12.0–16.0)

## 2016-10-01 MED FILL — LEVOTHYROXINE 112 MCG TAB: 112 | 90 days supply | Qty: 90 | Fill #0

## 2016-11-21 DIAGNOSIS — Z1389 Encounter for screening for other disorder: Secondary | ICD-10-CM | POA: Diagnosis not present

## 2016-11-21 DIAGNOSIS — E042 Nontoxic multinodular goiter: Secondary | ICD-10-CM | POA: Diagnosis not present

## 2016-11-23 MED FILL — MEDROXYPROGESTERONE 2.5 MG: 2.5 | 90 days supply | Qty: 90 | Fill #0

## 2016-11-23 MED FILL — ESTRADIOL 0.5 MG TABLET: 0.5 | 90 days supply | Qty: 90 | Fill #0

## 2016-12-06 DIAGNOSIS — H524 Presbyopia: Secondary | ICD-10-CM | POA: Diagnosis not present

## 2016-12-25 MED FILL — LEVOTHYROXINE 112 MCG TAB: 112 | 90 days supply | Qty: 90 | Fill #1

## 2017-01-23 DIAGNOSIS — D229 Melanocytic nevi, unspecified: Secondary | ICD-10-CM | POA: Diagnosis not present

## 2017-01-23 DIAGNOSIS — L570 Actinic keratosis: Secondary | ICD-10-CM | POA: Diagnosis not present

## 2017-01-23 DIAGNOSIS — L821 Other seborrheic keratosis: Secondary | ICD-10-CM | POA: Diagnosis not present

## 2017-02-13 MED FILL — IBUPROFEN 600 MG TABLET: 600 | 5 days supply | Qty: 20 | Fill #0

## 2017-02-27 MED FILL — ESTRADIOL 0.5 MG TABLET: 0.5 | 90 days supply | Qty: 90 | Fill #1

## 2017-02-27 MED FILL — MEDROXYPROGESTERONE 2.5 MG: 2.5 | 90 days supply | Qty: 90 | Fill #1

## 2017-03-26 MED FILL — LEVOTHYROXINE 112 MCG TAB: 112 | 90 days supply | Qty: 90 | Fill #2

## 2017-05-21 ENCOUNTER — Telehealth: Payer: Self-pay | Admitting: Obstetrics & Gynecology

## 2017-05-21 NOTE — Telephone Encounter (Signed)
Left message for pt to reschedule Shelley Drake appt.

## 2017-05-27 MED FILL — ESTRADIOL 0.5 MG TABLET: 0.5 | 90 days supply | Qty: 90 | Fill #2

## 2017-05-27 MED FILL — MEDROXYPROGESTERONE 2.5 MG: 2.5 | 90 days supply | Qty: 90 | Fill #2

## 2017-05-29 ENCOUNTER — Other Ambulatory Visit: Payer: Self-pay | Admitting: Endocrinology

## 2017-05-29 DIAGNOSIS — Z1231 Encounter for screening mammogram for malignant neoplasm of breast: Secondary | ICD-10-CM

## 2017-06-17 MED FILL — LEVOTHYROXINE 112 MCG TAB: 112 | 90 days supply | Qty: 90 | Fill #0

## 2017-07-03 DIAGNOSIS — R197 Diarrhea, unspecified: Secondary | ICD-10-CM | POA: Diagnosis not present

## 2017-07-03 DIAGNOSIS — E042 Nontoxic multinodular goiter: Secondary | ICD-10-CM | POA: Diagnosis not present

## 2017-07-03 DIAGNOSIS — Z1389 Encounter for screening for other disorder: Secondary | ICD-10-CM | POA: Diagnosis not present

## 2017-07-03 DIAGNOSIS — Z6820 Body mass index (BMI) 20.0-20.9, adult: Secondary | ICD-10-CM | POA: Diagnosis not present

## 2017-07-03 DIAGNOSIS — M859 Disorder of bone density and structure, unspecified: Secondary | ICD-10-CM | POA: Diagnosis not present

## 2017-07-09 ENCOUNTER — Other Ambulatory Visit: Payer: Self-pay | Admitting: Endocrinology

## 2017-07-09 DIAGNOSIS — R197 Diarrhea, unspecified: Secondary | ICD-10-CM

## 2017-07-12 ENCOUNTER — Ambulatory Visit: Payer: 59

## 2017-07-15 ENCOUNTER — Ambulatory Visit
Admission: RE | Admit: 2017-07-15 | Discharge: 2017-07-15 | Disposition: A | Discharge status: Home / Self Care | Payer: 59 | Source: Ambulatory Visit | Attending: Endocrinology | Admitting: Endocrinology

## 2017-07-15 DIAGNOSIS — R197 Diarrhea, unspecified: Secondary | ICD-10-CM

## 2017-07-15 MED ORDER — IOPAMIDOL (ISOVUE-300) INJECTION 61%
100.0000 mL | Freq: Once | INTRAVENOUS | Status: AC | PRN
  Administered 2017-07-15: 100 mL via INTRAVENOUS

## 2017-08-02 ENCOUNTER — Ambulatory Visit
Admission: RE | Admit: 2017-08-02 | Discharge: 2017-08-02 | Disposition: A | Discharge status: Home / Self Care | Payer: 59 | Source: Ambulatory Visit | Attending: Endocrinology | Admitting: Endocrinology

## 2017-08-02 DIAGNOSIS — Z1231 Encounter for screening mammogram for malignant neoplasm of breast: Secondary | ICD-10-CM | POA: Diagnosis not present

## 2017-08-23 DIAGNOSIS — R197 Diarrhea, unspecified: Secondary | ICD-10-CM | POA: Diagnosis not present

## 2017-08-26 MED FILL — MEDROXYPROGESTERONE 2.5 MG: 2.5 | 90 days supply | Qty: 90 | Fill #3

## 2017-08-26 MED FILL — ESTRADIOL 0.5 MG TABLET: 0.5 | 90 days supply | Qty: 90 | Fill #3

## 2017-08-27 DIAGNOSIS — R197 Diarrhea, unspecified: Secondary | ICD-10-CM | POA: Diagnosis not present

## 2017-09-16 MED FILL — LEVOTHYROXINE 112 MCG TAB: 112 | 90 days supply | Qty: 90 | Fill #1

## 2017-09-23 DIAGNOSIS — E739 Lactose intolerance, unspecified: Secondary | ICD-10-CM | POA: Diagnosis not present

## 2017-09-24 ENCOUNTER — Ambulatory Visit: Payer: 59 | Admitting: Nurse Practitioner

## 2017-10-02 ENCOUNTER — Encounter: Payer: Self-pay | Admitting: Certified Nurse Midwife

## 2017-10-02 ENCOUNTER — Other Ambulatory Visit: Payer: Self-pay

## 2017-10-02 ENCOUNTER — Ambulatory Visit (INDEPENDENT_AMBULATORY_CARE_PROVIDER_SITE_OTHER): Payer: 59 | Admitting: Certified Nurse Midwife

## 2017-10-02 VITALS — BP 108/68 | HR 64 | Resp 16 | Ht 63.0 in | Wt 121.0 lb

## 2017-10-02 DIAGNOSIS — E739 Lactose intolerance, unspecified: Secondary | ICD-10-CM | POA: Diagnosis not present

## 2017-10-02 DIAGNOSIS — Z79890 Hormone replacement therapy (postmenopausal): Secondary | ICD-10-CM | POA: Diagnosis not present

## 2017-10-02 DIAGNOSIS — N951 Menopausal and female climacteric states: Secondary | ICD-10-CM | POA: Diagnosis not present

## 2017-10-02 DIAGNOSIS — Z01419 Encounter for gynecological examination (general) (routine) without abnormal findings: Secondary | ICD-10-CM

## 2017-10-02 MED ORDER — MEDROXYPROGESTERONE ACETATE 2.5 MG PO TABS: 2.5000 mg | ORAL_TABLET | Freq: Every day | ORAL | 0 refills | Status: DC

## 2017-10-02 MED ORDER — ESTRADIOL 0.5 MG PO TABS: ORAL_TABLET | ORAL | 1 refills | Status: DC

## 2017-10-02 NOTE — Progress Notes (Signed)
61 y.o. G0P0000 Divorced  Caucasian Fe here for annual exam. Menopausal on HRT. Denies vaginal bleeding or vaginal dryness. Has been diagnosed with lactose intolerance due to diarrhea. Taking Lactaid with good success. Had CT of body and all negative, when trying to evaluate diarrhea. Sees PCP yearly for labs and hypothyroid management. All stable per patient. Plans to wean of HRT . 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 No health issues today.   Patient's last menstrual period was 12/28/2006.          Sexually active: Yes.   The current method of family planning is post menopausal status.    Exercising: Yes.    walking Smoker:  no  Health Maintenance: Pap:  09-13-15 neg HPV HR neg  History of Abnormal Pap: yes MMG:  08-02-17 category c density birads 1:neg Self Breast exams: yes Colonoscopy:  08/26/15 neg f/u 41yrs BMD:   2012 with dr Forde Dandy normal ( took Fosamax) TDaP:  2010 Shingles: not done Pneumonia: 2016 Hep C and HIV: both neg 2016 Labs: if needed   reports that  has never smoked. she has never used smokeless tobacco. She reports that she drinks about 1.2 oz of alcohol per week. She reports that she does not use drugs.  Past Medical History:  Diagnosis Date  . HSV infection   . Hypothyroid age 55  . Osteopenia     Past Surgical History:  Procedure Laterality Date  . APPENDECTOMY  age 73    Current Outpatient Medications  Medication Sig Dispense Refill  . aspirin 81 MG tablet Take 81 mg by mouth daily.    . calcium-vitamin D 250-100 MG-UNIT per tablet Take 1 tablet by mouth daily.    Marland Kitchen estradiol (ESTRACE) 0.5 MG tablet TAKE 1 TABLET (0.5 MG TOTAL) BY MOUTH DAILY. 90 tablet 4  . levothyroxine (SYNTHROID, LEVOTHROID) 112 MCG tablet Take 112 mcg by mouth daily.  1  . medroxyPROGESTERone (PROVERA) 2.5 MG tablet Take 1 tablet (2.5 mg total) by mouth daily. 90 tablet 4  . Multiple Vitamin (MULTIVITAMIN) tablet Take 1 tablet by mouth daily.    .  Turmeric 500 MG CAPS Take 1 capsule by mouth daily.     No current facility-administered medications for this visit.     Family History  Problem Relation Age of Onset  . Ovarian cancer Mother 54  . Esophageal cancer Father 58  . Thyroid disease Brother        hyperthyroid    ROS:  Pertinent items are noted in HPI.  Otherwise, a comprehensive ROS was negative.  Exam:   BP 108/68   Pulse 64   Resp 16   Ht 5\' 3"  (1.6 m)   Wt 121 lb (54.9 kg)   LMP 12/28/2006   BMI 21.43 kg/m  Height: 5\' 3"  (160 cm) Ht Readings from Last 3 Encounters:  10/02/17 5\' 3"  (1.6 m)  09/19/16 5\' 3"  (1.6 m)  09/13/15 5' 3.75" (1.619 m)    General appearance: alert, cooperative and appears stated age Head: Normocephalic, without obvious abnormality, atraumatic Neck: no adenopathy, supple, symmetrical, trachea midline and thyroid normal to inspection and palpation Lungs: clear to auscultation bilaterally Breasts: normal appearance, no masses or tenderness, No nipple retraction or dimpling, No nipple discharge or bleeding, No axillary or supraclavicular adenopathy Heart: regular rate and rhythm Abdomen: soft, non-tender; no masses,  no organomegaly Extremities: extremities normal, atraumatic, no cyanosis or edema Skin: Skin color, texture, turgor normal. No rashes or lesions  Lymph nodes: Cervical, supraclavicular, and axillary nodes normal. No abnormal inguinal nodes palpated Neurologic: Grossly normal   Pelvic: External genitalia:  no lesions              Urethra:  normal appearing urethra with no masses, tenderness or lesions              Bartholin's and Skene's: normal                 Vagina: normal appearing vagina with normal color and discharge, no lesions              Cervix: no cervical motion tenderness and no lesions              Pap taken: No. Bimanual Exam:  Uterus:  normal size, contour, position, consistency, mobility, non-tender              Adnexa: normal adnexa and no mass,  fullness, tenderness               Rectovaginal: Confirms               Anus:  normal sphincter tone, no lesions  Chaperone present: yes  A:  Well Woman with normal exam  Menopausal on HRT,   Hypothyroid with PCP management, all stable  BMD due  Family history of Ovarian cancer  P:   Reviewed health and wellness pertinent to exam  Discussed risks/benefits/warning signs of HRT and recommend weaning of over the next month or two. Patient agreeable. Will give Rx so she can wean down. Given instructions to start on 1/2 dose of Estrace and stay the same with Provera. Will call if issues with and when she has stopped.  Rx Estrace see order with instructions  Rx Provera see order with instructions  Continue follow up with PCP as indicated  Discussed limited evaluation available for ovarian cancer prevention. Recommend genetic evaluation, patient aware and declines. Discussed Ca 125 and PUS. She had CT that was normal so no further screening at this point.  Pap smear: no   counseled on breast self exam, mammography screening, feminine hygiene, menopause, adequate intake of calcium and vitamin D, diet and exercise  return annually or prn  An After Visit Summary was printed and given to the patient.

## 2017-10-02 NOTE — Patient Instructions (Signed)

## 2017-11-19 DIAGNOSIS — M859 Disorder of bone density and structure, unspecified: Secondary | ICD-10-CM | POA: Diagnosis not present

## 2017-11-19 DIAGNOSIS — E042 Nontoxic multinodular goiter: Secondary | ICD-10-CM | POA: Diagnosis not present

## 2017-11-19 DIAGNOSIS — Z1389 Encounter for screening for other disorder: Secondary | ICD-10-CM | POA: Diagnosis not present

## 2017-11-19 DIAGNOSIS — Z6821 Body mass index (BMI) 21.0-21.9, adult: Secondary | ICD-10-CM | POA: Diagnosis not present

## 2017-12-23 DIAGNOSIS — H524 Presbyopia: Secondary | ICD-10-CM | POA: Diagnosis not present

## 2017-12-27 MED FILL — LEVOTHYROXINE 112 MCG TAB: 112 | 90 days supply | Qty: 90 | Fill #0

## 2018-01-10 DIAGNOSIS — D3704 Neoplasm of uncertain behavior of the minor salivary glands: Secondary | ICD-10-CM | POA: Diagnosis not present

## 2018-01-15 DIAGNOSIS — K1379 Other lesions of oral mucosa: Secondary | ICD-10-CM | POA: Diagnosis not present

## 2018-03-07 ENCOUNTER — Other Ambulatory Visit: Payer: Self-pay | Admitting: Nurse Practitioner

## 2018-04-01 MED FILL — LEVOTHYROXINE 112 MCG TAB: 112 | 90 days supply | Qty: 90 | Fill #1

## 2018-06-25 DIAGNOSIS — L570 Actinic keratosis: Secondary | ICD-10-CM | POA: Diagnosis not present

## 2018-06-25 DIAGNOSIS — D229 Melanocytic nevi, unspecified: Secondary | ICD-10-CM | POA: Diagnosis not present

## 2018-07-02 MED FILL — LEVOTHYROXINE 112 MCG TAB: 112 | 90 days supply | Qty: 90 | Fill #0

## 2018-07-04 ENCOUNTER — Other Ambulatory Visit: Payer: Self-pay | Admitting: Endocrinology

## 2018-07-04 DIAGNOSIS — Z1231 Encounter for screening mammogram for malignant neoplasm of breast: Secondary | ICD-10-CM

## 2018-08-08 ENCOUNTER — Ambulatory Visit
Admission: RE | Admit: 2018-08-08 | Discharge: 2018-08-08 | Disposition: A | Discharge status: Home / Self Care | Payer: 59 | Source: Ambulatory Visit | Attending: Endocrinology | Admitting: Endocrinology

## 2018-08-08 DIAGNOSIS — Z1231 Encounter for screening mammogram for malignant neoplasm of breast: Secondary | ICD-10-CM | POA: Diagnosis not present

## 2018-08-27 DIAGNOSIS — E042 Nontoxic multinodular goiter: Secondary | ICD-10-CM | POA: Diagnosis not present

## 2018-08-27 DIAGNOSIS — Z6820 Body mass index (BMI) 20.0-20.9, adult: Secondary | ICD-10-CM | POA: Diagnosis not present

## 2018-08-27 DIAGNOSIS — R61 Generalized hyperhidrosis: Secondary | ICD-10-CM | POA: Diagnosis not present

## 2018-08-27 DIAGNOSIS — M859 Disorder of bone density and structure, unspecified: Secondary | ICD-10-CM | POA: Diagnosis not present

## 2018-08-27 DIAGNOSIS — R002 Palpitations: Secondary | ICD-10-CM | POA: Diagnosis not present

## 2018-09-03 DIAGNOSIS — S0501XA Injury of conjunctiva and corneal abrasion without foreign body, right eye, initial encounter: Secondary | ICD-10-CM | POA: Diagnosis not present

## 2018-10-08 ENCOUNTER — Other Ambulatory Visit: Payer: Self-pay

## 2018-10-08 ENCOUNTER — Encounter: Payer: Self-pay | Admitting: Certified Nurse Midwife

## 2018-10-08 ENCOUNTER — Ambulatory Visit (INDEPENDENT_AMBULATORY_CARE_PROVIDER_SITE_OTHER): Payer: 59 | Admitting: Certified Nurse Midwife

## 2018-10-08 ENCOUNTER — Other Ambulatory Visit (HOSPITAL_COMMUNITY)
Admission: RE | Admit: 2018-10-08 | Discharge: 2018-10-08 | Disposition: A | Discharge status: Home / Self Care | Payer: 59 | Source: Ambulatory Visit | Attending: Certified Nurse Midwife | Admitting: Certified Nurse Midwife

## 2018-10-08 VITALS — BP 100/64 | HR 70 | Resp 16 | Ht 63.5 in | Wt 117.0 lb

## 2018-10-08 DIAGNOSIS — Z124 Encounter for screening for malignant neoplasm of cervix: Secondary | ICD-10-CM

## 2018-10-08 DIAGNOSIS — Z01419 Encounter for gynecological examination (general) (routine) without abnormal findings: Secondary | ICD-10-CM | POA: Diagnosis not present

## 2018-10-08 DIAGNOSIS — E039 Hypothyroidism, unspecified: Secondary | ICD-10-CM

## 2018-10-08 DIAGNOSIS — Z23 Encounter for immunization: Secondary | ICD-10-CM | POA: Diagnosis not present

## 2018-10-08 NOTE — Patient Instructions (Signed)

## 2018-10-08 NOTE — Progress Notes (Signed)
62 y.o. G0P0000 Divorced  Caucasian Fe here for annual exam. Post menopausal, denies vaginal bleeding. Started having hot flashes, night sweats and palpitations and had thyroid checked and medication was adjusted. Feeling some better. Seeing Dr.South for aex and labs.  Patient's last menstrual period was 12/28/2006.          Sexually active: No.  The current method of family planning is post menopausal status.    Exercising: Yes.    walking Smoker:  no  Review of Systems  Constitutional: Negative.   HENT: Negative.   Eyes: Negative.   Respiratory: Negative.   Cardiovascular: Negative.   Gastrointestinal: Negative.   Genitourinary: Negative.   Musculoskeletal: Negative.   Skin: Negative.   Neurological: Negative.   Endo/Heme/Allergies: Negative.   Psychiatric/Behavioral: Negative.     Health Maintenance: Pap:  09-13-15 neg HPV HR neg History of Abnormal Pap: yes MMG:  08-08-18 category b density birads 1:neg Self Breast exams: yes Colonoscopy:  2016 f/u 60yrs BMD:   2012 with dr Forde Dandy TDaP:  2010 Shingles: not done Pneumonia: 2016 Hep C and HIV: both neg 2016 Labs: if needed   reports that she has never smoked. She has never used smokeless tobacco. She reports that she drinks alcohol. She reports that she does not use drugs.  Past Medical History:  Diagnosis Date  . Abnormal Pap smear of cervix    cryo therapy  . HSV infection   . Hypothyroid age 46  . Osteopenia     Past Surgical History:  Procedure Laterality Date  . APPENDECTOMY  age 88  . GYNECOLOGIC CRYOSURGERY     for abnormal pap smear    Current Outpatient Medications  Medication Sig Dispense Refill  . calcium-vitamin D 250-100 MG-UNIT per tablet Take 1 tablet by mouth daily.    Marland Kitchen levothyroxine (SYNTHROID, LEVOTHROID) 112 MCG tablet Take 112 mcg by mouth daily. Take 1/2 Monday, take 1 on tue, wed, thurs, Saturday & Sunday, none on friday  1  . Multiple Vitamin (MULTIVITAMIN) tablet Take 1 tablet by  mouth daily.     No current facility-administered medications for this visit.     Family History  Problem Relation Age of Onset  . Ovarian cancer Mother 5  . Esophageal cancer Father 4  . Thyroid disease Brother        hyperthyroid    ROS:  Pertinent items are noted in HPI.  Otherwise, a comprehensive ROS was negative.  Exam:   BP 100/64   Pulse 70   Resp 16   Ht 5' 3.5" (1.613 m)   Wt 117 lb (53.1 kg)   LMP 12/28/2006   BMI 20.40 kg/m  Height: 5' 3.5" (161.3 cm) Ht Readings from Last 3 Encounters:  10/08/18 5' 3.5" (1.613 m)  10/02/17 5\' 3"  (1.6 m)  09/19/16 5\' 3"  (1.6 m)    General appearance: alert, cooperative and appears stated age Head: Normocephalic, without obvious abnormality, atraumatic Neck: no adenopathy, supple, symmetrical, trachea midline and thyroid normal to inspection and palpation Lungs: clear to auscultation bilaterally Breasts: normal appearance, no masses or tenderness, No nipple retraction or dimpling, No nipple discharge or bleeding, No axillary or supraclavicular adenopathy Heart: regular rate and rhythm Abdomen: soft, non-tender; no masses,  no organomegaly Extremities: extremities normal, atraumatic, no cyanosis or edema Skin: Skin color, texture, turgor normal. No rashes or lesions Lymph nodes: Cervical, supraclavicular, and axillary nodes normal. No abnormal inguinal nodes palpated Neurologic: Grossly normal   Pelvic: External genitalia:  no lesions,  normal female              Urethra:  normal appearing urethra with no masses, tenderness or lesions              Bartholin's and Skene's: normal                 Vagina: normal appearing vagina with normal color and discharge, no lesions              Cervix: no cervical motion tenderness, no lesions and normal appearance              Pap taken: Yes.   Bimanual Exam:  Uterus:  normal size, contour, position, consistency, mobility, non-tender              Adnexa: normal adnexa and no mass,  fullness, tenderness               Rectovaginal: Confirms               Anus:  normal sphincter tone, no lesions  Chaperone present: yes  A:  Well Woman with normal exam  Post menopausal   Hypothyroid with change in dosage feeling much better, due for TSH check  Family history of Ovarian cancer aware of screening options and limitations  Immunization update TDAP  P:  Discussed normal findings of exam.  Aware of need to advise if vaginal bleeding  Lab: TSH  Had full body CT in 2018 and feels this is adequate at this point.  Requests TDAP  Pap smear: yes   counseled on breast self exam, mammography screening, feminine hygiene, adequate intake of calcium and vitamin D, diet and exercise  return annually or prn  An After Visit Summary was printed and given to the patient.

## 2018-10-09 LAB — TSH: TSH: 0.432 u[IU]/mL — ABNORMAL LOW (ref 0.450–4.500)

## 2018-10-10 ENCOUNTER — Other Ambulatory Visit: Payer: Self-pay | Admitting: Physician Assistant

## 2018-10-10 DIAGNOSIS — L570 Actinic keratosis: Secondary | ICD-10-CM | POA: Diagnosis not present

## 2018-10-10 DIAGNOSIS — D485 Neoplasm of uncertain behavior of skin: Secondary | ICD-10-CM | POA: Diagnosis not present

## 2018-10-10 LAB — CYTOLOGY - PAP
Diagnosis: NEGATIVE
HPV (WINDOPATH): NOT DETECTED

## 2018-10-15 MED FILL — LEVOTHYROXINE 112 MCG TAB: 112 | 90 days supply | Qty: 90 | Fill #1

## 2018-11-25 DIAGNOSIS — M859 Disorder of bone density and structure, unspecified: Secondary | ICD-10-CM | POA: Diagnosis not present

## 2018-11-25 DIAGNOSIS — E042 Nontoxic multinodular goiter: Secondary | ICD-10-CM | POA: Diagnosis not present

## 2018-11-25 DIAGNOSIS — Z6820 Body mass index (BMI) 20.0-20.9, adult: Secondary | ICD-10-CM | POA: Diagnosis not present

## 2018-12-05 DIAGNOSIS — M859 Disorder of bone density and structure, unspecified: Secondary | ICD-10-CM | POA: Diagnosis not present

## 2018-12-05 LAB — HM DEXA SCAN

## 2018-12-23 DIAGNOSIS — H5211 Myopia, right eye: Secondary | ICD-10-CM | POA: Diagnosis not present

## 2019-03-25 ENCOUNTER — Other Ambulatory Visit: Payer: Self-pay

## 2019-03-25 NOTE — Patient Outreach (Signed)
Health Risk assessment/ screening: Late entry: Phone call on 01/28/2019 to reviewed health risk assessment tool completed by patient during enrollment.  Patient reports she is doing well. Reports recent visit to dermatologist.  Denies any medication concern at this time. Denies any concerns for health at this time. Patient did request advance directive information.  PLAN: will mail advanced directive packet and contact again in 6 months.  Tomasa Rand, RN, BSN, CEN [facility] [employer] [phone]

## 2019-04-06 DIAGNOSIS — E042 Nontoxic multinodular goiter: Secondary | ICD-10-CM | POA: Diagnosis not present

## 2019-04-16 ENCOUNTER — Other Ambulatory Visit: Payer: Self-pay

## 2019-04-16 NOTE — Patient Outreach (Signed)
Health team Advantage:  Patient transitioned to [facility].  PLAN: close case, send MD case closure letter.  Tomasa Rand, RN, BSN, CEN [facility] [employer] [phone]

## 2019-06-04 DIAGNOSIS — E042 Nontoxic multinodular goiter: Secondary | ICD-10-CM | POA: Diagnosis not present

## 2019-06-04 DIAGNOSIS — M859 Disorder of bone density and structure, unspecified: Secondary | ICD-10-CM | POA: Diagnosis not present

## 2019-06-04 DIAGNOSIS — R7989 Other specified abnormal findings of blood chemistry: Secondary | ICD-10-CM | POA: Diagnosis not present

## 2019-06-08 DIAGNOSIS — M8580 Other specified disorders of bone density and structure, unspecified site: Secondary | ICD-10-CM | POA: Diagnosis not present

## 2019-06-08 DIAGNOSIS — Z0000 Encounter for general adult medical examination without abnormal findings: Secondary | ICD-10-CM | POA: Diagnosis not present

## 2019-06-08 DIAGNOSIS — E042 Nontoxic multinodular goiter: Secondary | ICD-10-CM | POA: Diagnosis not present

## 2019-06-08 DIAGNOSIS — R002 Palpitations: Secondary | ICD-10-CM | POA: Diagnosis not present

## 2019-06-19 DIAGNOSIS — H43811 Vitreous degeneration, right eye: Secondary | ICD-10-CM | POA: Diagnosis not present

## 2019-06-29 ENCOUNTER — Other Ambulatory Visit: Payer: Self-pay | Admitting: Endocrinology

## 2019-06-29 DIAGNOSIS — Z1231 Encounter for screening mammogram for malignant neoplasm of breast: Secondary | ICD-10-CM

## 2019-08-01 DIAGNOSIS — Z23 Encounter for immunization: Secondary | ICD-10-CM | POA: Diagnosis not present

## 2019-08-03 ENCOUNTER — Ambulatory Visit: Payer: 59

## 2019-08-13 ENCOUNTER — Ambulatory Visit
Admission: RE | Admit: 2019-08-13 | Discharge: 2019-08-13 | Disposition: A | Discharge status: Home / Self Care | Payer: PPO | Source: Ambulatory Visit | Attending: Endocrinology | Admitting: Endocrinology

## 2019-08-13 ENCOUNTER — Other Ambulatory Visit: Payer: Self-pay

## 2019-08-13 DIAGNOSIS — Z1231 Encounter for screening mammogram for malignant neoplasm of breast: Secondary | ICD-10-CM | POA: Diagnosis not present

## 2019-09-08 DIAGNOSIS — D229 Melanocytic nevi, unspecified: Secondary | ICD-10-CM | POA: Diagnosis not present

## 2019-09-08 DIAGNOSIS — L570 Actinic keratosis: Secondary | ICD-10-CM | POA: Diagnosis not present

## 2019-10-16 ENCOUNTER — Ambulatory Visit: Payer: 59 | Admitting: Certified Nurse Midwife

## 2019-10-19 ENCOUNTER — Other Ambulatory Visit: Payer: Self-pay

## 2019-10-21 ENCOUNTER — Other Ambulatory Visit: Payer: Self-pay

## 2019-10-21 ENCOUNTER — Ambulatory Visit (INDEPENDENT_AMBULATORY_CARE_PROVIDER_SITE_OTHER): Payer: PPO | Admitting: Certified Nurse Midwife

## 2019-10-21 ENCOUNTER — Encounter: Payer: Self-pay | Admitting: Certified Nurse Midwife

## 2019-10-21 VITALS — BP 102/68 | HR 68 | Temp 97.5°F | Resp 16 | Ht 63.25 in | Wt 119.0 lb

## 2019-10-21 DIAGNOSIS — Z01419 Encounter for gynecological examination (general) (routine) without abnormal findings: Secondary | ICD-10-CM | POA: Diagnosis not present

## 2019-10-21 NOTE — Progress Notes (Signed)
63 y.o. G0P0000 Divorced  Caucasian Fe here for annual exam. Post menopausal no HRT. Denies vaginal bleeding or vaginal dryness. Saw Dr.South for aex and labs. All labs were normal and dexa scan was normal. Playing pickleball and golf and enjoying retirement. No other health issues today.  Patient's last menstrual period was 12/28/2006.          Sexually active: No.  The current method of family planning is post menopausal status.    Exercising: Yes.    golf, pickleball Smoker:  no  Review of Systems  Constitutional: Negative.   HENT: Negative.   Eyes: Negative.   Respiratory: Negative.   Cardiovascular: Negative.   Gastrointestinal: Negative.   Genitourinary: Negative.   Musculoskeletal: Negative.   Skin: Negative.   Neurological: Negative.   Endo/Heme/Allergies: Negative.   Psychiatric/Behavioral: Negative.     Health Maintenance: Pap:  09-13-15 neg HPV HR neg, 10-08-18 neg HPV HR neg History of Abnormal Pap: yes MMG:  08-14-2019 category b density birads 1:neg Self Breast exams: yes Colonoscopy:  2016 f/u 40yrs BMD:   2020 with dr Forde Dandy TDaP:  2019 Shingles: not done Pneumonia: 2016 Hep C and HIV: both neg 2016 Labs: with PCP   reports that she has never smoked. She has never used smokeless tobacco. She reports previous alcohol use. She reports that she does not use drugs.  Past Medical History:  Diagnosis Date  . Abnormal Pap smear of cervix    cryo therapy  . HSV infection   . Hypothyroid age 89  . Osteopenia     Past Surgical History:  Procedure Laterality Date  . APPENDECTOMY  age 23  . GYNECOLOGIC CRYOSURGERY     for abnormal pap smear    Current Outpatient Medications  Medication Sig Dispense Refill  . calcium-vitamin D 250-100 MG-UNIT per tablet Take 1 tablet by mouth daily.    Marland Kitchen levothyroxine (SYNTHROID, LEVOTHROID) 112 MCG tablet Take 112 mcg by mouth daily. Take 1/2 Monday, take 1 on tue, wed, thurs, Saturday & Sunday, none on friday  1   No  current facility-administered medications for this visit.    Family History  Problem Relation Age of Onset  . Ovarian cancer Mother 31  . Esophageal cancer Father 5  . Thyroid disease Brother        hyperthyroid    ROS:  Pertinent items are noted in HPI.  Otherwise, a comprehensive ROS was negative.  Exam:   BP 102/68   Pulse 68   Temp (!) 97.5 F (36.4 C) (Skin)   Resp 16   Ht 5' 3.25" (1.607 m)   Wt 119 lb (54 kg)   LMP 12/28/2006   BMI 20.91 kg/m  Height: 5' 3.25" (160.7 cm) Ht Readings from Last 3 Encounters:  10/21/19 5' 3.25" (1.607 m)  10/08/18 5' 3.5" (1.613 m)  10/02/17 5\' 3"  (1.6 m)    General appearance: alert, cooperative and appears stated age Head: Normocephalic, without obvious abnormality, atraumatic Neck: no adenopathy, supple, symmetrical, trachea midline and thyroid normal to inspection and palpation Lungs: clear to auscultation bilaterally Breasts: normal appearance, no masses or tenderness, No nipple retraction or dimpling, No nipple discharge or bleeding, No axillary or supraclavicular adenopathy Heart: regular rate and rhythm Abdomen: soft, non-tender; no masses,  no organomegaly Extremities: extremities normal, atraumatic, no cyanosis or edema Skin: Skin color, texture, turgor normal. No rashes or lesions Lymph nodes: Cervical, supraclavicular, and axillary nodes normal. No abnormal inguinal nodes palpated Neurologic: Grossly normal  Pelvic: External genitalia:  no lesions              Urethra:  normal appearing urethra with no masses, tenderness or lesions              Bartholin's and Skene's: normal                 Vagina: normal appearing vagina with normal color and discharge, no lesions              Cervix: no cervical motion tenderness, no lesions and nulliparous appearance              Pap taken: No. Bimanual Exam:  Uterus:  normal size, contour, position, consistency, mobility, non-tender and anteverted              Adnexa: normal  adnexa and no mass, fullness, tenderness               Rectovaginal: Confirms               Anus:  normal sphincter tone, no lesions  Chaperone present: yes  A:  Well Woman with normal exam  Post menopausal no HRT  Labs normal with PCP    P:   Reviewed health and wellness pertinent to exam  Aware of need to advise if vaginal bleeding.  Continue with PCP for aex,labs.  Pap smear: no   counseled on breast self exam, mammography screening, feminine hygiene, menopause, adequate intake of calcium and vitamin D, diet and exercise  return annually or prn  An After Visit Summary was printed and given to the patient.

## 2019-10-21 NOTE — Patient Instructions (Signed)

## 2020-01-19 ENCOUNTER — Encounter: Payer: Self-pay | Admitting: Certified Nurse Midwife

## 2020-03-17 DIAGNOSIS — M179 Osteoarthritis of knee, unspecified: Secondary | ICD-10-CM | POA: Insufficient documentation

## 2020-03-17 DIAGNOSIS — M25562 Pain in left knee: Secondary | ICD-10-CM | POA: Diagnosis not present

## 2020-03-17 IMAGING — MG DIGITAL SCREENING BILATERAL MAMMOGRAM WITH TOMO AND CAD
6 of 10 series · 6 of 30 positions shown · non-contrast
Comparison: Previous exam(s).

CLINICAL DATA: Screening.

EXAM:
DIGITAL SCREENING BILATERAL MAMMOGRAM WITH TOMO AND CAD

[L CC synth-2D]
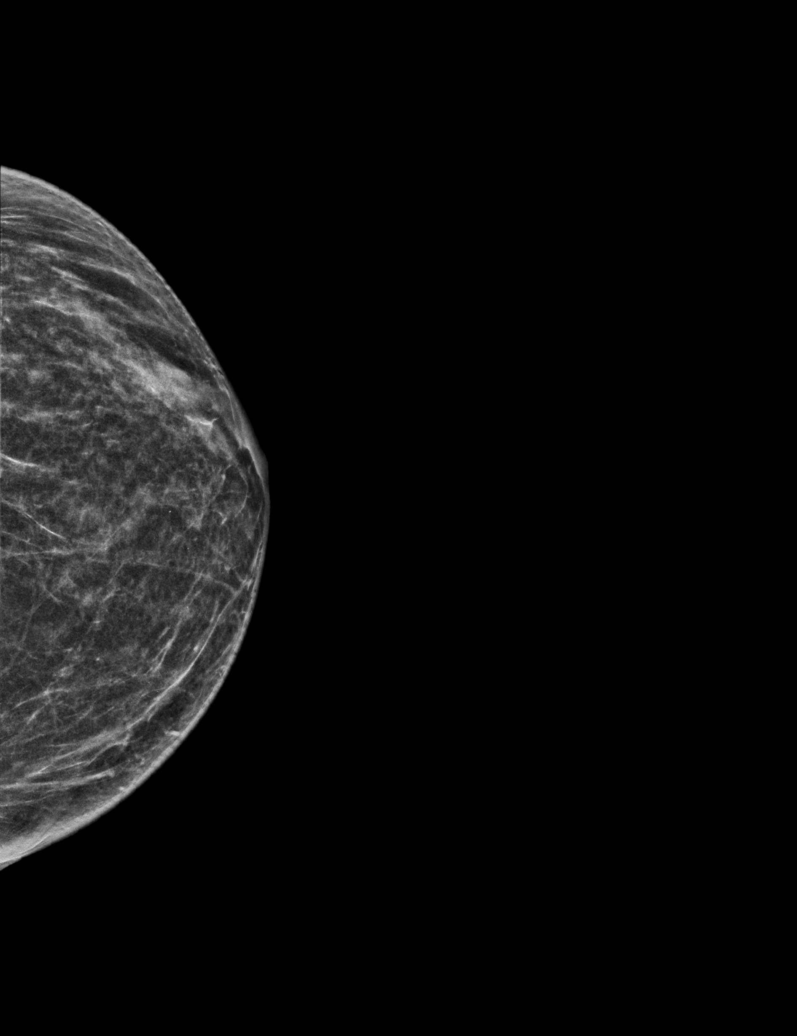

[L MLO synth-2D (1 of 2)]
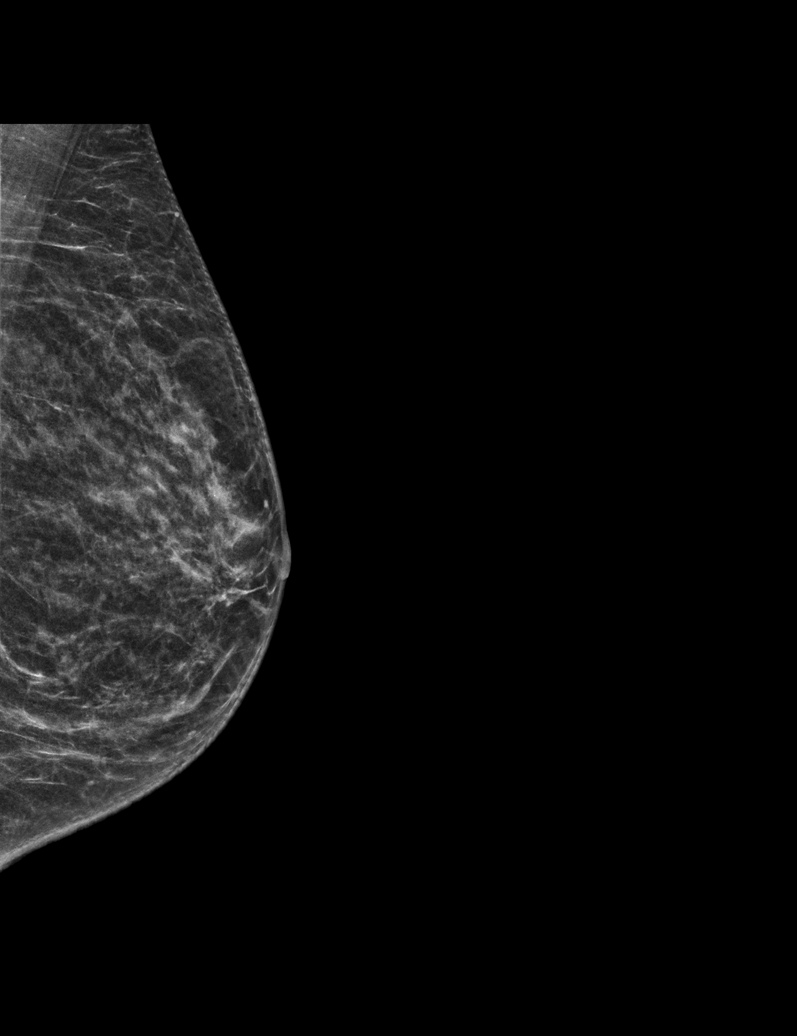

[R CC synth-2D]
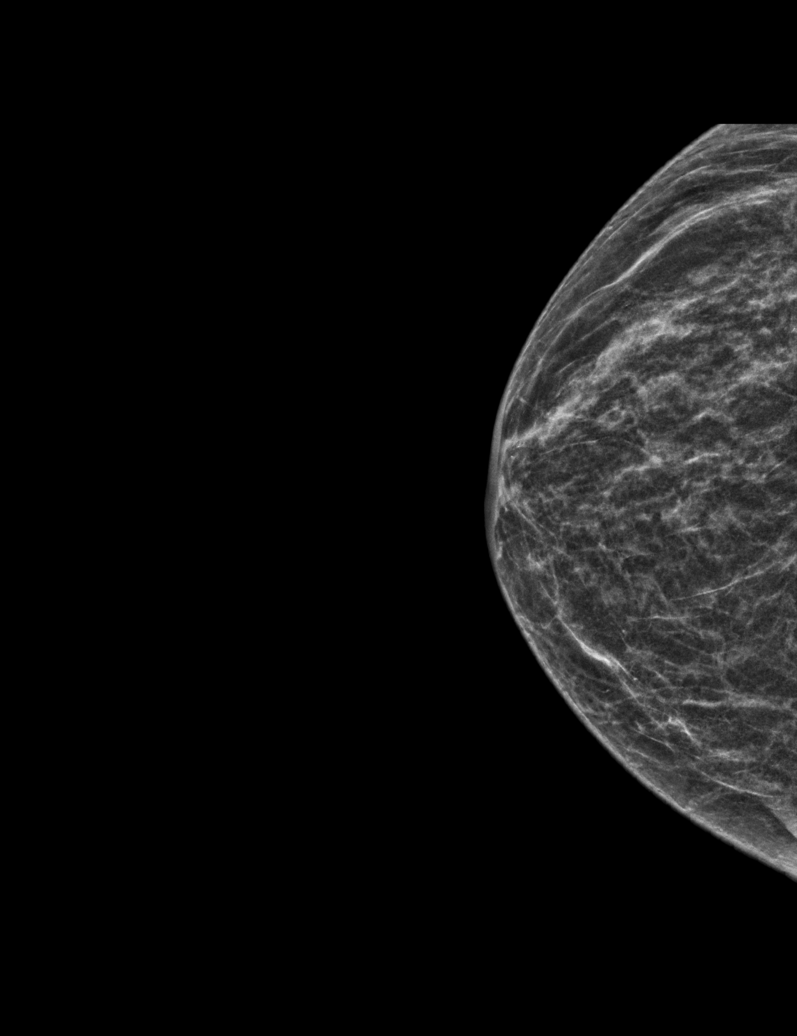

[R MLO synth-2D]
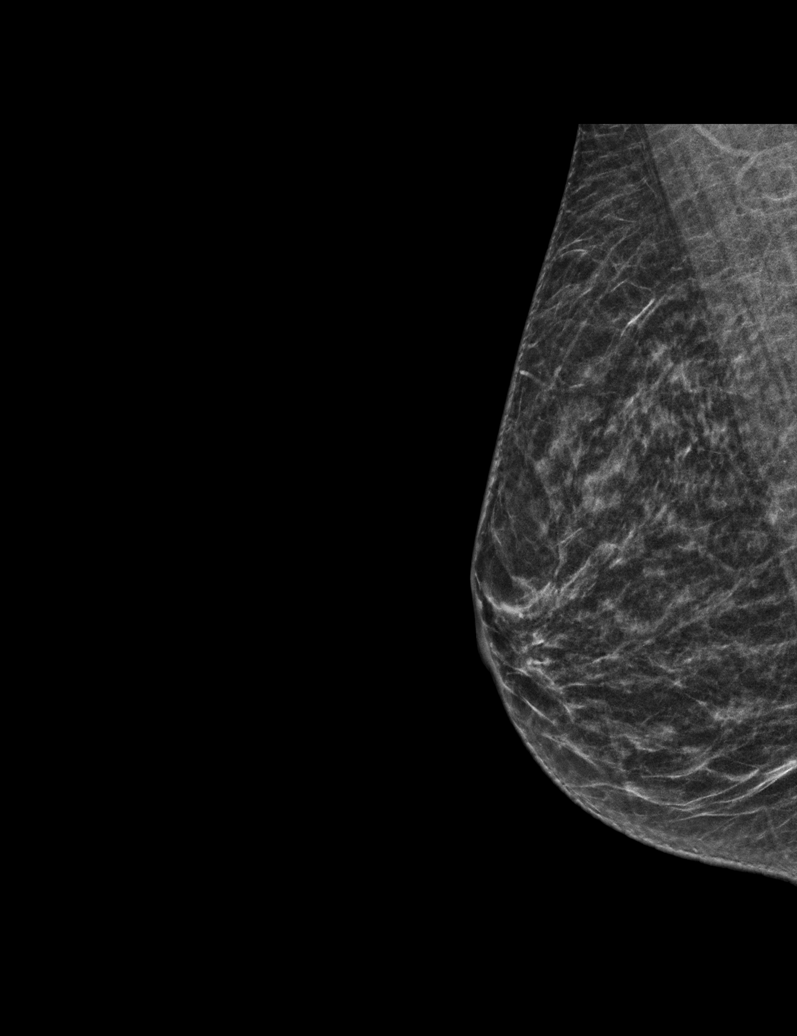

[L MLO synth-2D (2 of 2)]
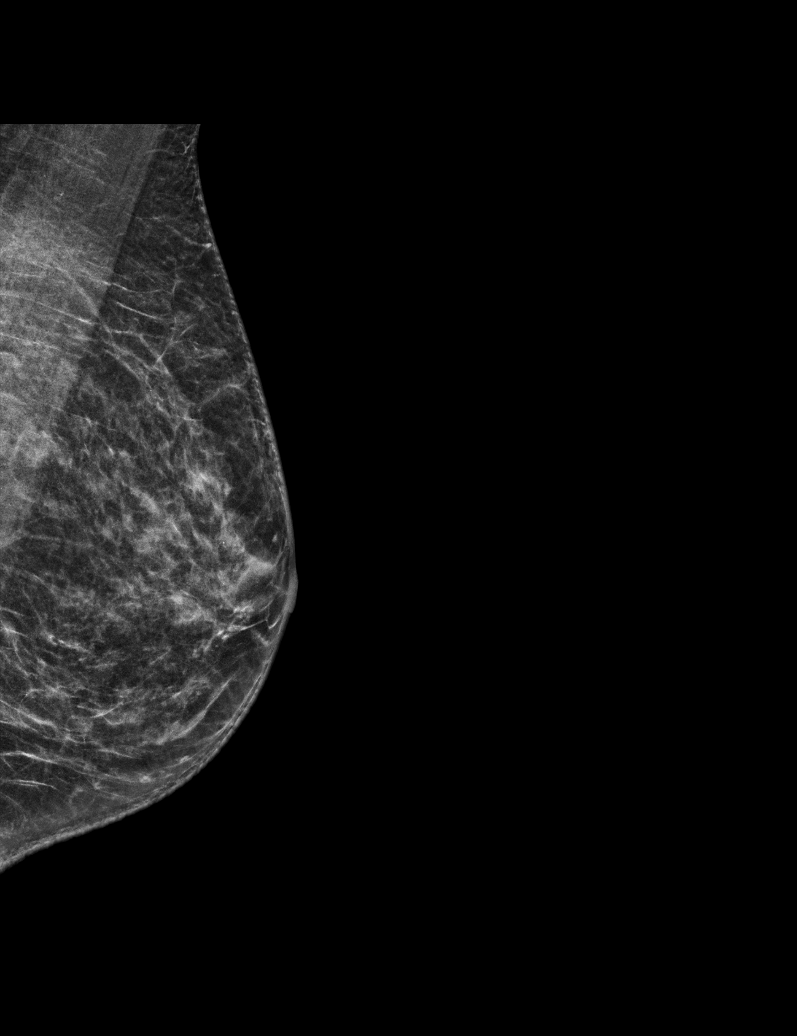

[L MLO tomo · tomo slice 25/49.0]
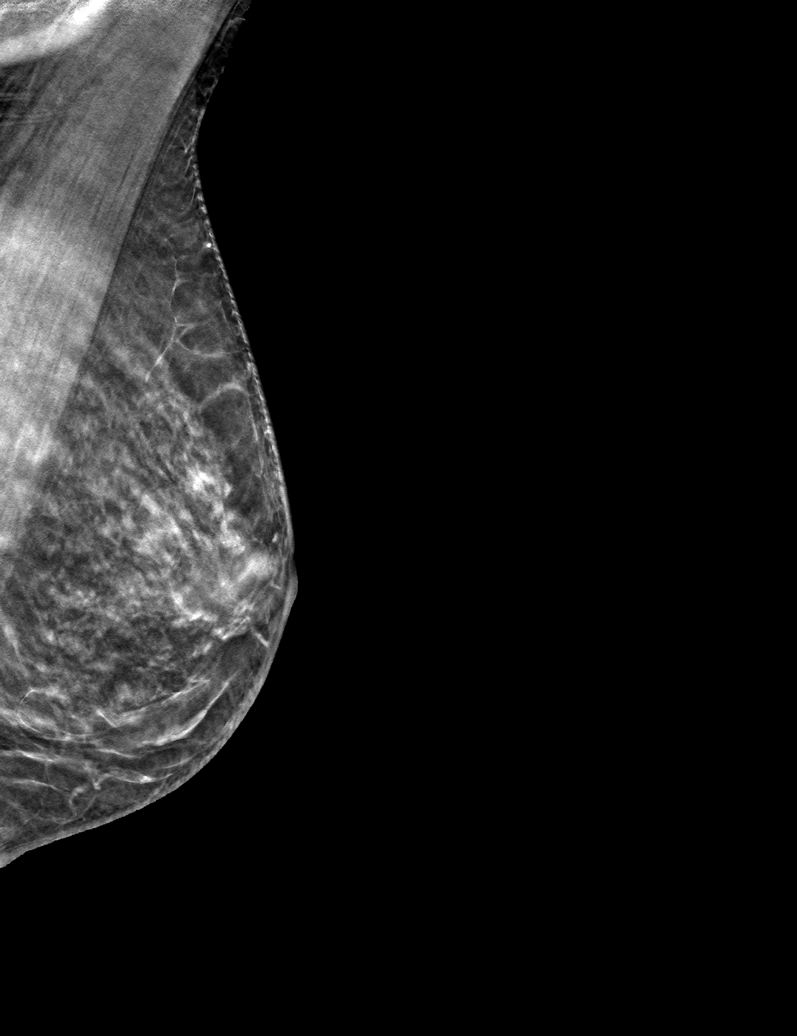

[6 of 30 positions shown; findings below may reference images not displayed]

ACR Breast Density Category b: There are scattered areas of
fibroglandular density.
FINDINGS: There are no findings suspicious for malignancy. Images were
processed with CAD.
IMPRESSION: No mammographic evidence of malignancy. A result letter of this
screening mammogram will be mailed directly to the patient.

RECOMMENDATION:
Screening mammogram in one year. (Code:CN-U-775)

BI-RADS CATEGORY  1: Negative.

## 2020-06-21 DIAGNOSIS — Z79899 Other long term (current) drug therapy: Secondary | ICD-10-CM | POA: Diagnosis not present

## 2020-06-21 DIAGNOSIS — M8580 Other specified disorders of bone density and structure, unspecified site: Secondary | ICD-10-CM | POA: Diagnosis not present

## 2020-06-21 DIAGNOSIS — E042 Nontoxic multinodular goiter: Secondary | ICD-10-CM | POA: Diagnosis not present

## 2020-06-27 DIAGNOSIS — E042 Nontoxic multinodular goiter: Secondary | ICD-10-CM | POA: Diagnosis not present

## 2020-06-27 DIAGNOSIS — M179 Osteoarthritis of knee, unspecified: Secondary | ICD-10-CM | POA: Diagnosis not present

## 2020-06-27 DIAGNOSIS — Z0000 Encounter for general adult medical examination without abnormal findings: Secondary | ICD-10-CM | POA: Diagnosis not present

## 2020-06-27 DIAGNOSIS — M8580 Other specified disorders of bone density and structure, unspecified site: Secondary | ICD-10-CM | POA: Diagnosis not present

## 2020-06-27 DIAGNOSIS — R82998 Other abnormal findings in urine: Secondary | ICD-10-CM | POA: Diagnosis not present

## 2020-06-27 DIAGNOSIS — K921 Melena: Secondary | ICD-10-CM | POA: Diagnosis not present

## 2020-06-29 ENCOUNTER — Other Ambulatory Visit: Payer: Self-pay | Admitting: Endocrinology

## 2020-06-29 DIAGNOSIS — Z1231 Encounter for screening mammogram for malignant neoplasm of breast: Secondary | ICD-10-CM

## 2020-07-05 DIAGNOSIS — K921 Melena: Secondary | ICD-10-CM | POA: Diagnosis not present

## 2020-07-21 DIAGNOSIS — R195 Other fecal abnormalities: Secondary | ICD-10-CM | POA: Diagnosis not present

## 2020-08-06 DIAGNOSIS — Z23 Encounter for immunization: Secondary | ICD-10-CM | POA: Diagnosis not present

## 2020-08-17 ENCOUNTER — Other Ambulatory Visit: Payer: Self-pay

## 2020-08-17 ENCOUNTER — Ambulatory Visit
Admission: RE | Admit: 2020-08-17 | Discharge: 2020-08-17 | Disposition: A | Discharge status: Home / Self Care | Payer: PPO | Source: Ambulatory Visit | Attending: Endocrinology | Admitting: Endocrinology

## 2020-08-17 DIAGNOSIS — Z1231 Encounter for screening mammogram for malignant neoplasm of breast: Secondary | ICD-10-CM | POA: Diagnosis not present

## 2020-08-26 DIAGNOSIS — Z1159 Encounter for screening for other viral diseases: Secondary | ICD-10-CM | POA: Diagnosis not present

## 2020-08-31 DIAGNOSIS — K648 Other hemorrhoids: Secondary | ICD-10-CM | POA: Diagnosis not present

## 2020-08-31 DIAGNOSIS — R195 Other fecal abnormalities: Secondary | ICD-10-CM | POA: Diagnosis not present

## 2020-08-31 DIAGNOSIS — K5730 Diverticulosis of large intestine without perforation or abscess without bleeding: Secondary | ICD-10-CM | POA: Diagnosis not present

## 2020-10-31 ENCOUNTER — Ambulatory Visit: Payer: PPO | Admitting: Certified Nurse Midwife

## 2020-11-01 NOTE — Progress Notes (Signed)
65 y.o. G0P0000 Divorced White or Caucasian Not Hispanic or Latino female here for annual exam.  No vaginal bleeding. No bowel or bladder c/o. Not sexually active.     Patient's last menstrual period was 12/28/2006.          Sexually active: No.  The current method of family planning is post menopausal status.    Exercising: Yes.    golf and walking Smoker:  no  Health Maintenance: Pap:  10-08-18 negative, HR HPV negative            09-13-15 negative, HR HPV negative  History of abnormal Pap:  Yes had cryosurgery in her 20's MMG:  08-17-20 density B/BIRADS 1 negative  BMD:   2020 with PCP, stable osteopenia. Was on fosamax for 15 years, started in her 28's. Now with very mild osteopenia.  Colonoscopy: November 2021, f/u 10 years  TDaP:  2019   Gardasil: n/a   reports that she has never smoked. She has never used smokeless tobacco. She reports current alcohol use of about 1.0 standard drink of alcohol per week. She reports that she does not use drugs. Retired NP in Neurology (previously internal medicine).  Past Medical History:  Diagnosis Date  . Abnormal Pap smear of cervix    cryo therapy  . HSV infection   . Hypothyroid age 17  . Osteopenia     Past Surgical History:  Procedure Laterality Date  . APPENDECTOMY  age 56  . GYNECOLOGIC CRYOSURGERY     for abnormal pap smear    Current Outpatient Medications  Medication Sig Dispense Refill  . calcium-vitamin D 250-100 MG-UNIT per tablet Take 1 tablet by mouth daily.    Marland Kitchen levothyroxine (SYNTHROID, LEVOTHROID) 112 MCG tablet Take 112 mcg by mouth daily. Take 1/2 Monday, take 1 on tue, wed, thurs, Saturday & Sunday, none on friday  1  . TURMERIC PO Take 500 mg by mouth.     No current facility-administered medications for this visit.    Family History  Problem Relation Age of Onset  . Ovarian cancer Mother 81  . Esophageal cancer Father 68  . Thyroid disease Brother        hyperthyroid  . Breast cancer Neg Hx   Mother  was 89 when diagnosed with ovarian cancer. She was advised by her mother's oncologist to get yearly exams by a GYN. Declines CA 125's and ultrasounds.   Review of Systems  Constitutional: Negative.   HENT: Negative.   Eyes: Negative.   Respiratory: Negative.   Cardiovascular: Negative.   Gastrointestinal: Negative.   Endocrine: Negative.   Genitourinary: Negative.   Musculoskeletal: Negative.   Skin: Negative.   Allergic/Immunologic: Negative.   Neurological: Negative.   Hematological: Negative.   Psychiatric/Behavioral: Negative.     Exam:   BP 126/72 (BP Location: Right Arm, Patient Position: Sitting, Cuff Size: Normal)   Pulse 72   Resp 16   Ht 5' 2.5" (1.588 m)   Wt 117 lb (53.1 kg)   LMP 12/28/2006   BMI 21.06 kg/m   Weight change: @WEIGHTCHANGE @ Height:   Height: 5' 2.5" (158.8 cm)  Ht Readings from Last 3 Encounters:  11/03/20 5' 2.5" (1.588 m)  10/21/19 5' 3.25" (1.607 m)  10/08/18 5' 3.5" (1.613 m)    General appearance: alert, cooperative and appears stated age Head: Normocephalic, without obvious abnormality, atraumatic Neck: no adenopathy, supple, symmetrical, trachea midline and thyroid normal to inspection and palpation Lungs: clear to auscultation bilaterally Cardiovascular: regular rate  and rhythm Breasts: normal appearance, no masses or tenderness Abdomen: soft, non-tender; non distended,  no masses,  no organomegaly Extremities: extremities normal, atraumatic, no cyanosis or edema Skin: Skin color, texture, turgor normal. No rashes or lesions Lymph nodes: Cervical, supraclavicular, and axillary nodes normal. No abnormal inguinal nodes palpated Neurologic: Grossly normal   Pelvic: External genitalia:  no lesions              Urethra:  normal appearing urethra with no masses, tenderness or lesions              Bartholins and Skenes: normal                 Vagina: normal appearing vagina with normal color and discharge, no lesions               Cervix: no lesions               Bimanual Exam:  Uterus:  normal size, contour, position, consistency, mobility, non-tender and anteverted              Adnexa: no mass, fullness, tenderness               Rectovaginal: Confirms               Anus:  normal sphincter tone, no lesions  Terence Lux chaperoned for the exam.  A:  Well Woman with normal exam  PMP  FH of ovarian cancer  P:   No pap this year  Mammogram, colonoscopy and DEXA are UTD  Labs with primary  Discussed breast self exam  Discussed calcium and vit D intake

## 2020-11-03 ENCOUNTER — Other Ambulatory Visit: Payer: Self-pay

## 2020-11-03 ENCOUNTER — Encounter: Payer: Self-pay | Admitting: Obstetrics and Gynecology

## 2020-11-03 ENCOUNTER — Ambulatory Visit (INDEPENDENT_AMBULATORY_CARE_PROVIDER_SITE_OTHER): Payer: PPO | Admitting: Obstetrics and Gynecology

## 2020-11-03 VITALS — BP 126/72 | HR 72 | Resp 16 | Ht 62.5 in | Wt 117.0 lb

## 2020-11-03 DIAGNOSIS — E739 Lactose intolerance, unspecified: Secondary | ICD-10-CM | POA: Insufficient documentation

## 2020-11-03 DIAGNOSIS — Z780 Asymptomatic menopausal state: Secondary | ICD-10-CM

## 2020-11-03 DIAGNOSIS — Z01419 Encounter for gynecological examination (general) (routine) without abnormal findings: Secondary | ICD-10-CM

## 2020-11-03 DIAGNOSIS — Z8041 Family history of malignant neoplasm of ovary: Secondary | ICD-10-CM

## 2020-11-03 NOTE — Patient Instructions (Signed)

## 2021-01-03 ENCOUNTER — Other Ambulatory Visit: Payer: Self-pay

## 2021-01-03 ENCOUNTER — Encounter: Payer: Self-pay | Admitting: Physician Assistant

## 2021-01-03 ENCOUNTER — Ambulatory Visit: Payer: PPO | Admitting: Physician Assistant

## 2021-01-03 DIAGNOSIS — D229 Melanocytic nevi, unspecified: Secondary | ICD-10-CM

## 2021-01-03 DIAGNOSIS — L821 Other seborrheic keratosis: Secondary | ICD-10-CM

## 2021-01-03 DIAGNOSIS — L578 Other skin changes due to chronic exposure to nonionizing radiation: Secondary | ICD-10-CM | POA: Diagnosis not present

## 2021-01-03 DIAGNOSIS — D1800 Hemangioma unspecified site: Secondary | ICD-10-CM

## 2021-01-03 DIAGNOSIS — Z1283 Encounter for screening for malignant neoplasm of skin: Secondary | ICD-10-CM

## 2021-01-03 DIAGNOSIS — L814 Other melanin hyperpigmentation: Secondary | ICD-10-CM | POA: Diagnosis not present

## 2021-01-17 NOTE — Progress Notes (Signed)
   Follow-Up Visit   Subjective  Shelley Drake is a 65 y.o. female who presents for the following: Annual Exam (Patient here today for yearly skin check no new concerns.).   The following portions of the chart were reviewed this encounter and updated as appropriate:  Allergies  Meds  Problems      Objective  Well appearing patient in no apparent distress; mood and affect are within normal limits.  A full examination was performed including scalp, head, eyes, ears, nose, lips, neck, chest, axillae, abdomen, back, buttocks, bilateral upper extremities, bilateral lower extremities, hands, feet, fingers, toes, fingernails, and toenails. All findings within normal limits unless otherwise noted below.  Objective  head to toe: Full body skin examination- no atypical moles or non mole skin cancer   Assessment & Plan  Encounter for screening for malignant neoplasm of skin head to toe  Yearly skin check  Lentigines - Scattered tan macules - Discussed due to sun exposure - Benign, observe - Call for any changes  Seborrheic Keratoses - Stuck-on, waxy, tan-brown papules and plaques  - Discussed benign etiology and prognosis. - Observe - Call for any changes  Melanocytic Nevi - Tan-brown and/or pink-flesh-colored symmetric macules and papules - Benign appearing on exam today - Observation - Call clinic for new or changing moles - Recommend daily use of broad spectrum spf 30+ sunscreen to sun-exposed areas.   Hemangiomas - Red papules - Discussed benign nature - Observe - Call for any changes  Actinic Damage - diffuse scaly erythematous macules with underlying dyspigmentation - Recommend daily broad spectrum sunscreen SPF 30+ to sun-exposed areas, reapply every 2 hours as needed.  - Call for new or changing lesions.    I, Roderick Sweezy, PA-C, have reviewed all documentation's for this visit.  The documentation on 01/17/21 for the exam, diagnosis, procedures and orders  are all accurate and complete.

## 2021-04-26 ENCOUNTER — Ambulatory Visit: Payer: PPO | Admitting: Podiatry

## 2021-04-26 ENCOUNTER — Other Ambulatory Visit: Payer: Self-pay

## 2021-04-26 ENCOUNTER — Encounter: Payer: Self-pay | Admitting: Podiatry

## 2021-04-26 DIAGNOSIS — Q828 Other specified congenital malformations of skin: Secondary | ICD-10-CM | POA: Diagnosis not present

## 2021-04-26 NOTE — Progress Notes (Signed)
Subjective:   Patient ID: Shelley Drake, female   DOB: 65 y.o.   MRN: [id number]   HPI Patient presents stating she has had a reoccurrence of lesion that we have treated a number of years ago right has structural bunion deformity of both feet and moderate flatfoot deformity.  Patient does not smoke likes to be very active   Review of Systems  All other systems reviewed and are negative.      Objective:  Physical Exam Vitals and nursing note reviewed.  Constitutional:      Appearance: She is well-developed.  Pulmonary:     Effort: Pulmonary effort is normal.  Musculoskeletal:        General: Normal range of motion.  Skin:    General: Skin is warm.  Neurological:     Mental Status: She is alert.    Neurovascular status intact muscle strength adequate range of motion adequate with patient found out moderate flatfoot deformity bilateral prominence around the first metatarsal head bilateral slight redness with keratotic lesion subsecond metatarsal right that upon debridement shows lucent core and pain to direct pressure.  Good digital perfusion well oriented x3     Assessment:  Structural bunion deformity bilateral with plantar keratotic lesion porokeratotic or corn callus formation that may be related to the overall function and structure of the foot creating the pressure against this area     Plan:  H&P reviewed condition and sterile sharp debridement of lesion accomplished no iatrogenic bleeding applied medication to soften it and instructed padding discussed bunions do not recommend current surgery.  Reappoint as needed

## 2021-06-29 DIAGNOSIS — Z79899 Other long term (current) drug therapy: Secondary | ICD-10-CM | POA: Diagnosis not present

## 2021-06-29 DIAGNOSIS — M859 Disorder of bone density and structure, unspecified: Secondary | ICD-10-CM | POA: Diagnosis not present

## 2021-06-29 DIAGNOSIS — Z0000 Encounter for general adult medical examination without abnormal findings: Secondary | ICD-10-CM | POA: Diagnosis not present

## 2021-06-29 DIAGNOSIS — R002 Palpitations: Secondary | ICD-10-CM | POA: Diagnosis not present

## 2021-06-29 DIAGNOSIS — E042 Nontoxic multinodular goiter: Secondary | ICD-10-CM | POA: Diagnosis not present

## 2021-07-06 DIAGNOSIS — R002 Palpitations: Secondary | ICD-10-CM | POA: Diagnosis not present

## 2021-07-06 DIAGNOSIS — Z1331 Encounter for screening for depression: Secondary | ICD-10-CM | POA: Diagnosis not present

## 2021-07-06 DIAGNOSIS — Z23 Encounter for immunization: Secondary | ICD-10-CM | POA: Diagnosis not present

## 2021-07-06 DIAGNOSIS — R82998 Other abnormal findings in urine: Secondary | ICD-10-CM | POA: Diagnosis not present

## 2021-07-06 DIAGNOSIS — M8580 Other specified disorders of bone density and structure, unspecified site: Secondary | ICD-10-CM | POA: Diagnosis not present

## 2021-07-06 DIAGNOSIS — Z1339 Encounter for screening examination for other mental health and behavioral disorders: Secondary | ICD-10-CM | POA: Diagnosis not present

## 2021-07-06 DIAGNOSIS — M179 Osteoarthritis of knee, unspecified: Secondary | ICD-10-CM | POA: Diagnosis not present

## 2021-07-06 DIAGNOSIS — Z0000 Encounter for general adult medical examination without abnormal findings: Secondary | ICD-10-CM | POA: Diagnosis not present

## 2021-07-06 DIAGNOSIS — E042 Nontoxic multinodular goiter: Secondary | ICD-10-CM | POA: Diagnosis not present

## 2021-07-10 ENCOUNTER — Other Ambulatory Visit: Payer: Self-pay | Admitting: Endocrinology

## 2021-07-10 DIAGNOSIS — Z1231 Encounter for screening mammogram for malignant neoplasm of breast: Secondary | ICD-10-CM

## 2021-08-21 ENCOUNTER — Other Ambulatory Visit: Payer: Self-pay

## 2021-08-21 ENCOUNTER — Ambulatory Visit
Admission: RE | Admit: 2021-08-21 | Discharge: 2021-08-21 | Disposition: A | Discharge status: Home / Self Care | Payer: PPO | Source: Ambulatory Visit | Attending: Endocrinology | Admitting: Endocrinology

## 2021-08-21 DIAGNOSIS — Z1231 Encounter for screening mammogram for malignant neoplasm of breast: Secondary | ICD-10-CM | POA: Diagnosis not present

## 2021-11-10 DIAGNOSIS — R002 Palpitations: Secondary | ICD-10-CM | POA: Diagnosis not present

## 2021-11-10 DIAGNOSIS — M179 Osteoarthritis of knee, unspecified: Secondary | ICD-10-CM | POA: Diagnosis not present

## 2021-11-10 DIAGNOSIS — M8580 Other specified disorders of bone density and structure, unspecified site: Secondary | ICD-10-CM | POA: Diagnosis not present

## 2021-11-10 DIAGNOSIS — E042 Nontoxic multinodular goiter: Secondary | ICD-10-CM | POA: Diagnosis not present

## 2022-01-03 ENCOUNTER — Other Ambulatory Visit: Payer: Self-pay

## 2022-01-03 ENCOUNTER — Encounter: Payer: Self-pay | Admitting: Physician Assistant

## 2022-01-03 ENCOUNTER — Ambulatory Visit: Payer: PPO | Admitting: Physician Assistant

## 2022-01-03 DIAGNOSIS — L570 Actinic keratosis: Secondary | ICD-10-CM

## 2022-01-03 DIAGNOSIS — Z1283 Encounter for screening for malignant neoplasm of skin: Secondary | ICD-10-CM | POA: Diagnosis not present

## 2022-01-03 MED ORDER — TOLAK 4 % EX CREA: TOPICAL_CREAM | CUTANEOUS | 1 refills | Status: DC

## 2022-01-03 NOTE — Progress Notes (Signed)
? ?  Follow-Up Visit ?  ?Subjective  ?Shelley Drake is a 66 y.o. female who presents for the following: Annual Exam (Re check nose lesion was present in December but self resolve ). It is still a little pink and scaly.  ? ? ?The following portions of the chart were reviewed this encounter and updated as appropriate:  Tobacco  Allergies  Meds  Problems  Med Hx  Surg Hx  Fam Hx   ?  ? ?Objective  ?Well appearing patient in no apparent distress; mood and affect are within normal limits. ? ?A full examination was performed including scalp, head, eyes, ears, nose, lips, neck, chest, axillae, abdomen, back, buttocks, bilateral upper extremities, bilateral lower extremities, hands, feet, fingers, toes, fingernails, and toenails. All findings within normal limits unless otherwise noted below. ? ?head to toe ?No atypical nevi or signs of NMSC noted at the time of the visit.  ? ?Dorsum of Nose ?Erythematous patches with gritty scale. ? ? ?Assessment & Plan  ?Encounter for screening for malignant neoplasm of skin ?head to toe ? ?Yearly skin examination  ? ?AK (actinic keratosis) ?Dorsum of Nose ? ?T[place]ak this winter ? ?Fluorouracil (TOLAK) 4 % CREA - Dorsum of Nose ?Apply to affected area in October Monday- Sunday x 2 weeks. ? ? ? ?I, Delayza Lungren, PA-C, have reviewed all documentation's for this visit.  The documentation on 01/03/22 for the exam, diagnosis, procedures and orders are all accurate and complete. ?

## 2022-01-10 NOTE — Progress Notes (Signed)
66 y.o. Sublimity Divorced White or Caucasian Not Hispanic or Latino female here for annual exam.  No vaginal bleeding. No bowel or bladder c/o.  ?  ?H/O genital hsv, no outbreaks for 20 years.  ? ?Patient's last menstrual period was 12/28/2006.          ?Sexually active: No.  ?The current method of family planning is post menopausal status.    ?Exercising: Yes.     Pickle ball  ?Smoker:  no ? ?Health Maintenance: ?Pap:  10-08-18 negative, HR HPV negative  ?          09-13-15 negative, HR HPV negative  ?History of abnormal Pap:  Yes had cryosurgery in her 20's ?MMG:  08/23/21 density B Bi-rads 1 neg  ?BMD:   2020 with PCP, stable osteopenia. Was on fosamax for 15 years, started in her 50's. Now with very mild osteopenia.  ?Colonoscopy: 2021, f/u 10 years  ?TDaP:  2019  ? Gardasil: n/a ? ? reports that she has never smoked. She has never used smokeless tobacco. She reports current alcohol use of about 1.0 standard drink per week. She reports that she does not use drugs. Retired. She was a NP in Neurology (previously internal medicine). ? ?Past Medical History:  ?Diagnosis Date  ? Abnormal Pap smear of cervix   ? cryo therapy  ? HSV infection   ? Hypothyroid age 51  ? Osteopenia   ? ? ?Past Surgical History:  ?Procedure Laterality Date  ? APPENDECTOMY  age 23  ? GYNECOLOGIC CRYOSURGERY    ? for abnormal pap smear  ? ? ?Current Outpatient Medications  ?Medication Sig Dispense Refill  ? levothyroxine (SYNTHROID) 125 MCG tablet Take 125 mcg by mouth daily.    ? Fluorouracil (TOLAK) 4 % CREA Apply to affected area in October Monday- Sunday x 2 weeks. (Patient not taking: Reported on 01/17/2022) 40 g 1  ? ?No current facility-administered medications for this visit.  ? ? ?Family History  ?Problem Relation Age of Onset  ? Ovarian cancer Mother 44  ? Esophageal cancer Father 43  ? Thyroid disease Brother   ?     hyperthyroid  ? Breast cancer Neg Hx   ?Mother was 5 when diagnosed with ovarian cancer. She was advised by her  mother's oncologist to get yearly exams by a GYN. Declines CA 125's and ultrasounds.  ? ?Review of Systems  ?All other systems reviewed and are negative. ? ?Exam:   ?BP 110/68   Pulse 65   Ht 5' 3.5" (1.613 m)   Wt 114 lb (51.7 kg)   LMP 12/28/2006   SpO2 99%   BMI 19.88 kg/m?   Weight change: '@WEIGHTCHANGE'$ @ Height:   Height: 5' 3.5" (161.3 cm)  ?Ht Readings from Last 3 Encounters:  ?01/17/22 5' 3.5" (1.613 m)  ?11/03/20 5' 2.5" (1.588 m)  ?10/21/19 5' 3.25" (1.607 m)  ? ? ?General appearance: alert, cooperative and appears stated age ?Head: Normocephalic, without obvious abnormality, atraumatic ?Neck: no adenopathy, supple, symmetrical, trachea midline and thyroid normal to inspection and palpation ?Lungs: clear to auscultation bilaterally ?Cardiovascular: regular rate and rhythm ?Breasts: normal appearance, no masses or tenderness ?Abdomen: soft, non-tender; non distended,  no masses,  no organomegaly ?Extremities: extremities normal, atraumatic, no cyanosis or edema ?Skin: Skin color, texture, turgor normal. No rashes or lesions ?Lymph nodes: Cervical, supraclavicular, and axillary nodes normal. ?No abnormal inguinal nodes palpated ?Neurologic: Grossly normal ? ? ?Pelvic: External genitalia:  no lesions ?  Urethra:  normal appearing urethra with no masses, tenderness or lesions ?             Bartholins and Skenes: normal    ?             Vagina: normal appearing vagina with normal color and discharge, no lesions ?             Cervix: no lesions ?              ?Bimanual Exam:  Uterus:  normal size, contour, position, consistency, mobility, non-tender ?             Adnexa: no mass, fullness, tenderness ?              Rectovaginal: Confirms ?              Anus:  normal sphincter tone, no lesions ? ?Santiago Glad, CMA chaperoned for the exam. ? ?1. Well woman exam ?Discussed breast self exam ?Discussed calcium and vit D intake ?No pap this year ?Mammogram due in 10/23 ?Colonoscopy UTD ?Labs and Dexa with Dr  Forde Dandy ? ? ?2. History of herpes genitalis ?No outbreaks for a very long time. Will prescribe valtrex to have on hand incase she gets an outbreak ?- valACYclovir (VALTREX) 500 MG tablet; Take one tablet BID x 3 days prn outbreak.  Dispense: 30 tablet; Refill: 1 ? ?3. Family history of ovarian cancer ?Normal exam, she has declined u/s and CA 125's ? ? ? ?

## 2022-01-17 ENCOUNTER — Ambulatory Visit (INDEPENDENT_AMBULATORY_CARE_PROVIDER_SITE_OTHER): Payer: PPO | Admitting: Obstetrics and Gynecology

## 2022-01-17 ENCOUNTER — Encounter: Payer: Self-pay | Admitting: Obstetrics and Gynecology

## 2022-01-17 ENCOUNTER — Other Ambulatory Visit: Payer: Self-pay

## 2022-01-17 VITALS — BP 110/68 | HR 65 | Ht 63.5 in | Wt 114.0 lb

## 2022-01-17 DIAGNOSIS — Z8041 Family history of malignant neoplasm of ovary: Secondary | ICD-10-CM | POA: Diagnosis not present

## 2022-01-17 DIAGNOSIS — Z8619 Personal history of other infectious and parasitic diseases: Secondary | ICD-10-CM

## 2022-01-17 DIAGNOSIS — Z9189 Other specified personal risk factors, not elsewhere classified: Secondary | ICD-10-CM

## 2022-01-17 DIAGNOSIS — Z01419 Encounter for gynecological examination (general) (routine) without abnormal findings: Secondary | ICD-10-CM

## 2022-01-17 MED ORDER — VALACYCLOVIR HCL 500 MG PO TABS: ORAL_TABLET | ORAL | 1 refills | Status: DC

## 2022-01-17 NOTE — Patient Instructions (Signed)

## 2022-05-07 DIAGNOSIS — M8580 Other specified disorders of bone density and structure, unspecified site: Secondary | ICD-10-CM | POA: Diagnosis not present

## 2022-05-07 DIAGNOSIS — E042 Nontoxic multinodular goiter: Secondary | ICD-10-CM | POA: Diagnosis not present

## 2022-05-07 DIAGNOSIS — R002 Palpitations: Secondary | ICD-10-CM | POA: Diagnosis not present

## 2022-07-10 ENCOUNTER — Other Ambulatory Visit: Payer: Self-pay | Admitting: Endocrinology

## 2022-07-10 DIAGNOSIS — Z1231 Encounter for screening mammogram for malignant neoplasm of breast: Secondary | ICD-10-CM

## 2022-08-09 DIAGNOSIS — E039 Hypothyroidism, unspecified: Secondary | ICD-10-CM | POA: Diagnosis not present

## 2022-08-18 DIAGNOSIS — Z23 Encounter for immunization: Secondary | ICD-10-CM | POA: Diagnosis not present

## 2022-08-23 ENCOUNTER — Ambulatory Visit: Payer: Self-pay

## 2022-08-30 ENCOUNTER — Ambulatory Visit
Admission: RE | Admit: 2022-08-30 | Discharge: 2022-08-30 | Disposition: A | Discharge status: Home / Self Care | Payer: PPO | Source: Ambulatory Visit | Attending: Endocrinology | Admitting: Endocrinology

## 2022-08-30 DIAGNOSIS — Z1231 Encounter for screening mammogram for malignant neoplasm of breast: Secondary | ICD-10-CM | POA: Diagnosis not present

## 2022-08-31 DIAGNOSIS — E042 Nontoxic multinodular goiter: Secondary | ICD-10-CM | POA: Diagnosis not present

## 2022-08-31 DIAGNOSIS — R002 Palpitations: Secondary | ICD-10-CM | POA: Diagnosis not present

## 2022-08-31 DIAGNOSIS — R5383 Other fatigue: Secondary | ICD-10-CM | POA: Diagnosis not present

## 2022-10-12 DIAGNOSIS — E042 Nontoxic multinodular goiter: Secondary | ICD-10-CM | POA: Diagnosis not present

## 2022-11-15 DIAGNOSIS — E042 Nontoxic multinodular goiter: Secondary | ICD-10-CM | POA: Diagnosis not present

## 2022-11-15 DIAGNOSIS — M8589 Other specified disorders of bone density and structure, multiple sites: Secondary | ICD-10-CM | POA: Diagnosis not present

## 2022-11-15 DIAGNOSIS — R002 Palpitations: Secondary | ICD-10-CM | POA: Diagnosis not present

## 2022-11-15 DIAGNOSIS — Z79899 Other long term (current) drug therapy: Secondary | ICD-10-CM | POA: Diagnosis not present

## 2022-11-15 DIAGNOSIS — M8580 Other specified disorders of bone density and structure, unspecified site: Secondary | ICD-10-CM | POA: Diagnosis not present

## 2022-11-22 DIAGNOSIS — E042 Nontoxic multinodular goiter: Secondary | ICD-10-CM | POA: Diagnosis not present

## 2022-11-22 DIAGNOSIS — Z1339 Encounter for screening examination for other mental health and behavioral disorders: Secondary | ICD-10-CM | POA: Diagnosis not present

## 2022-11-22 DIAGNOSIS — M8580 Other specified disorders of bone density and structure, unspecified site: Secondary | ICD-10-CM | POA: Diagnosis not present

## 2022-11-22 DIAGNOSIS — Z1331 Encounter for screening for depression: Secondary | ICD-10-CM | POA: Diagnosis not present

## 2022-11-22 DIAGNOSIS — Z0000 Encounter for general adult medical examination without abnormal findings: Secondary | ICD-10-CM | POA: Diagnosis not present

## 2022-11-22 DIAGNOSIS — R82998 Other abnormal findings in urine: Secondary | ICD-10-CM | POA: Diagnosis not present

## 2022-11-30 DIAGNOSIS — L821 Other seborrheic keratosis: Secondary | ICD-10-CM | POA: Diagnosis not present

## 2022-11-30 DIAGNOSIS — D2262 Melanocytic nevi of left upper limb, including shoulder: Secondary | ICD-10-CM | POA: Diagnosis not present

## 2022-11-30 DIAGNOSIS — L570 Actinic keratosis: Secondary | ICD-10-CM | POA: Diagnosis not present

## 2022-11-30 DIAGNOSIS — L814 Other melanin hyperpigmentation: Secondary | ICD-10-CM | POA: Diagnosis not present

## 2022-11-30 DIAGNOSIS — D225 Melanocytic nevi of trunk: Secondary | ICD-10-CM | POA: Diagnosis not present

## 2022-11-30 DIAGNOSIS — L578 Other skin changes due to chronic exposure to nonionizing radiation: Secondary | ICD-10-CM | POA: Diagnosis not present

## 2022-12-07 DIAGNOSIS — R8290 Unspecified abnormal findings in urine: Secondary | ICD-10-CM | POA: Diagnosis not present

## 2022-12-07 DIAGNOSIS — M25562 Pain in left knee: Secondary | ICD-10-CM | POA: Diagnosis not present

## 2022-12-07 DIAGNOSIS — M1710 Unilateral primary osteoarthritis, unspecified knee: Secondary | ICD-10-CM | POA: Diagnosis not present

## 2022-12-18 DIAGNOSIS — R69 Illness, unspecified: Secondary | ICD-10-CM | POA: Diagnosis not present

## 2022-12-28 DIAGNOSIS — R69 Illness, unspecified: Secondary | ICD-10-CM | POA: Diagnosis not present

## 2022-12-28 DIAGNOSIS — M25562 Pain in left knee: Secondary | ICD-10-CM | POA: Diagnosis not present

## 2023-01-08 ENCOUNTER — Ambulatory Visit: Payer: PPO | Admitting: Physician Assistant

## 2023-01-14 NOTE — Progress Notes (Signed)
67 y.o. G0P0000 Divorced White or Caucasian Not Hispanic or Latino female here for annual exam.  No vaginal bleeding. Not sexually active.   Recently started dating a man she meet through pickle ball.    H/O HSV, no outbreaks.   Patient's last menstrual period was 12/28/2006.          Sexually active: No.  The current method of family planning is post menopausal status.    Exercising: Yes.    Gym/ health club routine includes swimming. Smoker:  no  Health Maintenance: Pap:  10-08-18 negative, HR HPV negative            09-13-15 negative, HR HPV negative  History of abnormal Pap:  Yes had cryosurgery in her 20's MMG:  08/30/22 bi-rads 1 neg  BMD:  2020 with PCP, stable osteopenia. Was on fosamax for 15 years, started in her 58's. Now with very mild osteopenia.  Colonoscopy: 2021, f/u 10 years  TDaP:  2019   Gardasil: n/a   reports that she has never smoked. She has never used smokeless tobacco. She reports current alcohol use of about 1.0 standard drink of alcohol per week. She reports that she does not use drugs. Retired. She was a NP in Neurology (previously internal medicine).   Past Medical History:  Diagnosis Date   Abnormal Pap smear of cervix    cryo therapy   HSV infection    Hypothyroid age 44   Osteopenia     Past Surgical History:  Procedure Laterality Date   APPENDECTOMY  age 28   GYNECOLOGIC CRYOSURGERY     for abnormal pap smear    Current Outpatient Medications  Medication Sig Dispense Refill   levothyroxine (SYNTHROID) 88 MCG tablet Take 88 mcg by mouth daily before breakfast.     valACYclovir (VALTREX) 500 MG tablet Take one tablet BID x 3 days prn outbreak. 30 tablet 1   No current facility-administered medications for this visit.    Family History  Problem Relation Age of Onset   Ovarian cancer Mother 68   Esophageal cancer Father 48   Thyroid disease Brother        hyperthyroid   Breast cancer Neg Hx     Review of Systems  All other systems  reviewed and are negative.   Exam:   BP 110/64   Pulse 78   Ht 5' 2.25" (1.581 m)   Wt 112 lb (50.8 kg)   LMP 12/28/2006   SpO2 100%   BMI 20.32 kg/m   Weight change: @WEIGHTCHANGE @ Height:   Height: 5' 2.25" (158.1 cm)  Ht Readings from Last 3 Encounters:  01/22/23 5' 2.25" (1.581 m)  01/17/22 5' 3.5" (1.613 m)  11/03/20 5' 2.5" (1.588 m)    General appearance: alert, cooperative and appears stated age Head: Normocephalic, without obvious abnormality, atraumatic Neck: no adenopathy, supple, symmetrical, trachea midline and thyroid normal to inspection and palpation Lungs: clear to auscultation bilaterally Cardiovascular: regular rate and rhythm Breasts: normal appearance, no masses or tenderness Abdomen: soft, non-tender; non distended,  no masses,  no organomegaly Extremities: extremities normal, atraumatic, no cyanosis or edema Skin: Skin color, texture, turgor normal. No rashes or lesions Lymph nodes: Cervical, supraclavicular, and axillary nodes normal. No abnormal inguinal nodes palpated Neurologic: Grossly normal   Pelvic: External genitalia:  no lesions              Urethra:  normal appearing urethra with no masses, tenderness or lesions  Bartholins and Skenes: normal                 Vagina: atrophic appearing vagina with normal color and discharge, no lesions. Narrow introitus.              Cervix: no lesions               Bimanual Exam:  Uterus:  normal size, contour, position, consistency, mobility, non-tender              Adnexa: no mass, fullness, tenderness               Rectovaginal: Confirms               Anus:  normal sphincter tone, no lesions  Gae Dry, CMA chaperoned for the exam.  1. GYN exam for high-risk Medicare patient Discussed breast self exam Discussed calcium and vit D intake Labs with primary Mammogram and colonoscopy UTD DEXA with primary  2. Screening for cervical cancer - Cytology - PAP  3. History of herpes  genitalis - valACYclovir (VALTREX) 500 MG tablet; Take one tablet BID x 3 days prn outbreak.  Dispense: 30 tablet; Refill: 1  4. Family history of ovarian cancer Declines u/s, ca 125  5. Vaginal atrophy We discussed possible use of vaginal estrogen. I suspect she will need this if she wants to be sexually active. She will call if she wants to start it.

## 2023-01-15 DIAGNOSIS — R69 Illness, unspecified: Secondary | ICD-10-CM | POA: Diagnosis not present

## 2023-01-22 ENCOUNTER — Encounter: Payer: Self-pay | Admitting: Obstetrics and Gynecology

## 2023-01-22 ENCOUNTER — Ambulatory Visit (INDEPENDENT_AMBULATORY_CARE_PROVIDER_SITE_OTHER): Payer: Medicare HMO | Admitting: Obstetrics and Gynecology

## 2023-01-22 ENCOUNTER — Other Ambulatory Visit (HOSPITAL_COMMUNITY)
Admission: RE | Admit: 2023-01-22 | Discharge: 2023-01-22 | Disposition: A | Discharge status: Home / Self Care | Payer: Medicare HMO | Source: Ambulatory Visit | Attending: Obstetrics and Gynecology | Admitting: Obstetrics and Gynecology

## 2023-01-22 VITALS — BP 110/64 | HR 78 | Ht 62.25 in | Wt 112.0 lb

## 2023-01-22 DIAGNOSIS — Z124 Encounter for screening for malignant neoplasm of cervix: Secondary | ICD-10-CM

## 2023-01-22 DIAGNOSIS — Z8619 Personal history of other infectious and parasitic diseases: Secondary | ICD-10-CM

## 2023-01-22 DIAGNOSIS — Z01419 Encounter for gynecological examination (general) (routine) without abnormal findings: Secondary | ICD-10-CM | POA: Insufficient documentation

## 2023-01-22 DIAGNOSIS — Z1151 Encounter for screening for human papillomavirus (HPV): Secondary | ICD-10-CM | POA: Insufficient documentation

## 2023-01-22 DIAGNOSIS — N952 Postmenopausal atrophic vaginitis: Secondary | ICD-10-CM | POA: Diagnosis not present

## 2023-01-22 DIAGNOSIS — Z9189 Other specified personal risk factors, not elsewhere classified: Secondary | ICD-10-CM

## 2023-01-22 DIAGNOSIS — B009 Herpesviral infection, unspecified: Secondary | ICD-10-CM | POA: Diagnosis not present

## 2023-01-22 DIAGNOSIS — Z8041 Family history of malignant neoplasm of ovary: Secondary | ICD-10-CM

## 2023-01-22 DIAGNOSIS — R69 Illness, unspecified: Secondary | ICD-10-CM | POA: Diagnosis not present

## 2023-01-22 DIAGNOSIS — M1710 Unilateral primary osteoarthritis, unspecified knee: Secondary | ICD-10-CM | POA: Insufficient documentation

## 2023-01-22 MED ORDER — VALACYCLOVIR HCL 500 MG PO TABS: ORAL_TABLET | ORAL | 1 refills | Status: DC

## 2023-01-25 LAB — CYTOLOGY - PAP
Comment: NEGATIVE
Diagnosis: NEGATIVE
High risk HPV: NEGATIVE

## 2023-02-14 DIAGNOSIS — R69 Illness, unspecified: Secondary | ICD-10-CM | POA: Diagnosis not present

## 2023-02-28 DIAGNOSIS — L570 Actinic keratosis: Secondary | ICD-10-CM | POA: Diagnosis not present

## 2023-02-28 DIAGNOSIS — D485 Neoplasm of uncertain behavior of skin: Secondary | ICD-10-CM | POA: Diagnosis not present

## 2023-03-14 ENCOUNTER — Telehealth: Payer: Self-pay

## 2023-03-14 NOTE — Telephone Encounter (Signed)
Pt calling to report that she would like to get started on the vaginal estrogen per discussion from AEX visit on 01/22/2023 for her vaginal atrophy.    Recall placed for AEX in 2024. Last mammo-08/30/2022-neg birads 1   Request script to be sent to Karin Golden in chart.

## 2023-03-15 MED ORDER — ESTRADIOL 10 MCG VA TABS: ORAL_TABLET | VAGINAL | 4 refills | Status: DC

## 2023-03-15 NOTE — Telephone Encounter (Signed)
Patient notified. Patient read back medication instructions. Patient verbalizes understanding and is agreeable.   Encounter closed.

## 2023-03-15 NOTE — Telephone Encounter (Signed)
Script sent  

## 2023-04-24 DIAGNOSIS — D485 Neoplasm of uncertain behavior of skin: Secondary | ICD-10-CM | POA: Diagnosis not present

## 2023-04-25 DIAGNOSIS — R69 Illness, unspecified: Secondary | ICD-10-CM | POA: Diagnosis not present

## 2023-04-30 DIAGNOSIS — H52223 Regular astigmatism, bilateral: Secondary | ICD-10-CM | POA: Diagnosis not present

## 2023-04-30 DIAGNOSIS — Z0100 Encounter for examination of eyes and vision without abnormal findings: Secondary | ICD-10-CM | POA: Diagnosis not present

## 2023-05-14 DIAGNOSIS — R69 Illness, unspecified: Secondary | ICD-10-CM | POA: Diagnosis not present

## 2023-05-27 DIAGNOSIS — E042 Nontoxic multinodular goiter: Secondary | ICD-10-CM | POA: Diagnosis not present

## 2023-05-27 DIAGNOSIS — M8580 Other specified disorders of bone density and structure, unspecified site: Secondary | ICD-10-CM | POA: Diagnosis not present

## 2023-05-27 DIAGNOSIS — M179 Osteoarthritis of knee, unspecified: Secondary | ICD-10-CM | POA: Diagnosis not present

## 2023-05-27 DIAGNOSIS — M1710 Unilateral primary osteoarthritis, unspecified knee: Secondary | ICD-10-CM | POA: Diagnosis not present

## 2023-06-18 DIAGNOSIS — R69 Illness, unspecified: Secondary | ICD-10-CM | POA: Diagnosis not present

## 2023-07-15 DIAGNOSIS — R69 Illness, unspecified: Secondary | ICD-10-CM | POA: Diagnosis not present

## 2023-07-24 ENCOUNTER — Other Ambulatory Visit: Payer: Self-pay | Admitting: Endocrinology

## 2023-07-24 DIAGNOSIS — Z1231 Encounter for screening mammogram for malignant neoplasm of breast: Secondary | ICD-10-CM

## 2023-08-03 DIAGNOSIS — R69 Illness, unspecified: Secondary | ICD-10-CM | POA: Diagnosis not present

## 2023-08-17 DIAGNOSIS — Z23 Encounter for immunization: Secondary | ICD-10-CM | POA: Diagnosis not present

## 2023-09-03 ENCOUNTER — Ambulatory Visit
Admission: RE | Admit: 2023-09-03 | Discharge: 2023-09-03 | Disposition: A | Discharge status: Home / Self Care | Payer: Medicare HMO | Source: Ambulatory Visit | Attending: Endocrinology | Admitting: Endocrinology

## 2023-09-03 DIAGNOSIS — Z1231 Encounter for screening mammogram for malignant neoplasm of breast: Secondary | ICD-10-CM | POA: Diagnosis not present

## 2023-09-24 DIAGNOSIS — R69 Illness, unspecified: Secondary | ICD-10-CM | POA: Diagnosis not present

## 2023-11-27 DIAGNOSIS — Z0000 Encounter for general adult medical examination without abnormal findings: Secondary | ICD-10-CM | POA: Diagnosis not present

## 2023-11-27 DIAGNOSIS — Z1339 Encounter for screening examination for other mental health and behavioral disorders: Secondary | ICD-10-CM | POA: Diagnosis not present

## 2023-11-27 DIAGNOSIS — E042 Nontoxic multinodular goiter: Secondary | ICD-10-CM | POA: Diagnosis not present

## 2023-11-27 DIAGNOSIS — Z1331 Encounter for screening for depression: Secondary | ICD-10-CM | POA: Diagnosis not present

## 2023-11-27 DIAGNOSIS — M179 Osteoarthritis of knee, unspecified: Secondary | ICD-10-CM | POA: Diagnosis not present

## 2023-11-27 DIAGNOSIS — R82998 Other abnormal findings in urine: Secondary | ICD-10-CM | POA: Diagnosis not present

## 2023-11-27 DIAGNOSIS — M8580 Other specified disorders of bone density and structure, unspecified site: Secondary | ICD-10-CM | POA: Diagnosis not present

## 2023-12-09 DIAGNOSIS — D1801 Hemangioma of skin and subcutaneous tissue: Secondary | ICD-10-CM | POA: Diagnosis not present

## 2023-12-09 DIAGNOSIS — L821 Other seborrheic keratosis: Secondary | ICD-10-CM | POA: Diagnosis not present

## 2023-12-09 DIAGNOSIS — D2262 Melanocytic nevi of left upper limb, including shoulder: Secondary | ICD-10-CM | POA: Diagnosis not present

## 2023-12-09 DIAGNOSIS — L570 Actinic keratosis: Secondary | ICD-10-CM | POA: Diagnosis not present

## 2024-01-23 ENCOUNTER — Encounter: Payer: Self-pay | Admitting: Obstetrics and Gynecology

## 2024-01-23 ENCOUNTER — Other Ambulatory Visit (HOSPITAL_COMMUNITY)
Admission: AD | Admit: 2024-01-23 | Discharge: 2024-01-23 | Disposition: A | Discharge status: Home / Self Care | Source: Ambulatory Visit | Attending: Obstetrics and Gynecology | Admitting: Obstetrics and Gynecology

## 2024-01-23 ENCOUNTER — Ambulatory Visit (INDEPENDENT_AMBULATORY_CARE_PROVIDER_SITE_OTHER): Payer: Medicare HMO | Admitting: Obstetrics and Gynecology

## 2024-01-23 VITALS — BP 126/72 | HR 69 | Ht 62.75 in | Wt 111.0 lb

## 2024-01-23 DIAGNOSIS — Z01419 Encounter for gynecological examination (general) (routine) without abnormal findings: Secondary | ICD-10-CM

## 2024-01-23 DIAGNOSIS — Z124 Encounter for screening for malignant neoplasm of cervix: Secondary | ICD-10-CM

## 2024-01-23 DIAGNOSIS — Z1231 Encounter for screening mammogram for malignant neoplasm of breast: Secondary | ICD-10-CM

## 2024-01-23 DIAGNOSIS — E2839 Other primary ovarian failure: Secondary | ICD-10-CM

## 2024-01-23 DIAGNOSIS — Z9189 Other specified personal risk factors, not elsewhere classified: Secondary | ICD-10-CM

## 2024-01-23 DIAGNOSIS — Z9289 Personal history of other medical treatment: Secondary | ICD-10-CM

## 2024-01-23 DIAGNOSIS — N952 Postmenopausal atrophic vaginitis: Secondary | ICD-10-CM | POA: Diagnosis not present

## 2024-01-23 DIAGNOSIS — B009 Herpesviral infection, unspecified: Secondary | ICD-10-CM

## 2024-01-23 NOTE — Progress Notes (Signed)
 68 y.o. y.o. female here for medicare gyn exam Patient's last menstrual period was 12/28/2006.    G0P0000 Divorced White or Caucasian Not Hispanic or Latino female here for annual exam.  No vaginal bleeding. Not sexually active.   Recently started dating a man she meet through pickle ball. They are getting married soon.   H/O HSV, no outbreaks.   Patient's last menstrual period was 12/28/2006.          Sexually active: No.  The current method of family planning is post menopausal status.    Exercising: Yes.    Gym/ health club routine includes swimming. Smoker:  no  Health Maintenance: Pap:  10-08-18 negative, HR HPV negative            09-13-15 negative, HR HPV negative  History of abnormal Pap:  Yes had cryosurgery in her 20's MMG: 09/05/23 bi-rads 1 neg  BMD:  2020 with PCP, stable osteopenia. Was on fosamax for 15 years, started in her 37's. Now with very mild osteopenia per patient. To get records. PMD is managing and has bone scans at his office. Colonoscopy: 2021, f/u 10 years  TDaP:  2019   Gardasil: n/a Body mass index is 19.82 kg/m.      No data to display          Blood pressure 126/72, pulse 69, height 5' 2.75" (1.594 m), weight 111 lb (50.3 kg), last menstrual period 12/28/2006, SpO2 100%.     Component Value Date/Time   DIAGPAP  01/22/2023 1352    - Negative for intraepithelial lesion or malignancy (NILM)   DIAGPAP  10/08/2018 0000    NEGATIVE FOR INTRAEPITHELIAL LESIONS OR MALIGNANCY.   HPVHIGH Negative 01/22/2023 1352   ADEQPAP  01/22/2023 1352    Satisfactory for evaluation. The presence or absence of an   ADEQPAP  01/22/2023 1352    endocervical/transformation zone component cannot be determined because   ADEQPAP of atrophy. 01/22/2023 1352    GYN HISTORY:    Component Value Date/Time   DIAGPAP  01/22/2023 1352    - Negative for intraepithelial lesion or malignancy (NILM)   DIAGPAP  10/08/2018 0000    NEGATIVE FOR INTRAEPITHELIAL LESIONS  OR MALIGNANCY.   HPVHIGH Negative 01/22/2023 1352   ADEQPAP  01/22/2023 1352    Satisfactory for evaluation. The presence or absence of an   ADEQPAP  01/22/2023 1352    endocervical/transformation zone component cannot be determined because   ADEQPAP of atrophy. 01/22/2023 1352    OB History  Gravida Para Term Preterm AB Living  0 0 0 0 0 0  SAB IAB Ectopic Multiple Live Births  0 0 0 0 0    Past Medical History:  Diagnosis Date   Abnormal Pap smear of cervix    cryo therapy   HSV infection    Hypothyroid age 70   Osteopenia     Past Surgical History:  Procedure Laterality Date   APPENDECTOMY  age 55   GYNECOLOGIC CRYOSURGERY     for abnormal pap smear    Current Outpatient Medications on File Prior to Visit  Medication Sig Dispense Refill   Estradiol 10 MCG TABS vaginal tablet Place one tablet vaginally qhs x 1 week, then change to 2 x a week at hs 24 tablet 4   levothyroxine (SYNTHROID) 88 MCG tablet Take 88 mcg by mouth daily before breakfast.     valACYclovir (VALTREX) 500 MG tablet Take one tablet BID x 3 days prn outbreak.  30 tablet 1   No current facility-administered medications on file prior to visit.    Social History   Socioeconomic History   Marital status: Divorced    Spouse name: Not on file   Number of children: 0   Years of education: Not on file   Highest education level: Not on file  Occupational History    Employer: [employer]  Tobacco Use   Smoking status: Never   Smokeless tobacco: Never  Vaping Use   Vaping status: Never Used  Substance and Sexual Activity   Alcohol use: Yes    Alcohol/week: 1.0 standard drink of alcohol    Types: 1 Glasses of wine per week   Drug use: No   Sexual activity: Not Currently    Partners: Male    Birth control/protection: Post-menopausal    Comment: +5 partners and HSV HX  Other Topics Concern   Not on file  Social History Narrative   Publishing rights manager at [employer]   Social Drivers  of Health   Financial Resource Strain: Not on file  Food Insecurity: Not on file  Transportation Needs: Not on file  Physical Activity: Not on file  Stress: Not on file  Social Connections: Not on file  Intimate Partner Violence: Not on file    Family History  Problem Relation Age of Onset   Ovarian cancer Mother 72   Esophageal cancer Father 54   Thyroid disease Brother        hyperthyroid   Breast cancer Neg Hx      Allergies  Allergen Reactions   Codeine Nausea And Vomiting      Patient's last menstrual period was Patient's last menstrual period was 12/28/2006.Marland Kitchen             Review of Systems Alls systems reviewed and are negative.     Physical Exam Constitutional:      Appearance: Normal appearance.  Genitourinary:     Vulva and urethral meatus normal.     No lesions in the vagina.     Right Labia: No rash, lesions or skin changes.    Left Labia: No lesions, skin changes or rash.    No vaginal discharge or tenderness.     No vaginal prolapse present.    Mild vaginal atrophy present.     Right Adnexa: not tender, not palpable and no mass present.    Left Adnexa: not tender, not palpable and no mass present.    No cervical motion tenderness or discharge.     Uterus is not enlarged, tender or irregular.  Breasts:    Right: Normal.     Left: Normal.  HENT:     Head: Normocephalic.  Neck:     Thyroid: No thyroid mass, thyromegaly or thyroid tenderness.  Cardiovascular:     Rate and Rhythm: Normal rate and regular rhythm.     Heart sounds: Normal heart sounds, S1 normal and S2 normal.  Pulmonary:     Effort: Pulmonary effort is normal.     Breath sounds: Normal breath sounds and air entry.  Abdominal:     General: There is no distension.     Palpations: Abdomen is soft. There is no mass.     Tenderness: There is no abdominal tenderness. There is no guarding or rebound.  Musculoskeletal:        General: Normal range of motion.     Cervical back: Full  passive range of motion without pain, normal range of motion and  neck supple. No tenderness.     Right lower leg: No edema.     Left lower leg: No edema.  Neurological:     Mental Status: She is alert.  Skin:    General: Skin is warm.  Psychiatric:        Mood and Affect: Mood normal.        Behavior: Behavior normal.        Thought Content: Thought content normal.  Vitals and nursing note reviewed. Exam conducted with a chaperone present.    Joy was present for the entire physical   A:         Medicare gyn exam                             P:        Pap smear collected today Encouraged annual mammogram screening Colon cancer screening up-to-date DXA with PMD Labs and immunizations to do with PMD Encouraged healthy lifestyle practices Encouraged Vit D and Calcium   No follow-ups on file.  Earley Favor

## 2024-01-27 ENCOUNTER — Encounter: Payer: Self-pay | Admitting: Obstetrics and Gynecology

## 2024-01-27 DIAGNOSIS — M25511 Pain in right shoulder: Secondary | ICD-10-CM | POA: Diagnosis not present

## 2024-01-27 LAB — CYTOLOGY - PAP
Adequacy: ABSENT
Diagnosis: NEGATIVE

## 2024-01-27 MED ORDER — FLUCONAZOLE 150 MG PO TABS: 150.0000 mg | ORAL_TABLET | Freq: Once | ORAL | 0 refills | Status: AC

## 2024-02-12 DIAGNOSIS — M25511 Pain in right shoulder: Secondary | ICD-10-CM | POA: Diagnosis not present

## 2024-04-29 ENCOUNTER — Other Ambulatory Visit: Payer: Self-pay

## 2024-04-29 DIAGNOSIS — N952 Postmenopausal atrophic vaginitis: Secondary | ICD-10-CM

## 2024-04-29 MED ORDER — ESTRADIOL 10 MCG VA TABS: ORAL_TABLET | VAGINAL | 4 refills | Status: AC

## 2024-04-29 NOTE — Telephone Encounter (Signed)
 Med refill request: estradiol  10 mcg vaginal tablet Last AEX: 01/23/24 Next AEX: 01/25/25 Last MMG (if hormonal med) 09/03/23 BI-RADS 1 negative Refill authorized: Please Advise?

## 2024-04-30 DIAGNOSIS — Z0100 Encounter for examination of eyes and vision without abnormal findings: Secondary | ICD-10-CM | POA: Diagnosis not present

## 2024-04-30 DIAGNOSIS — H524 Presbyopia: Secondary | ICD-10-CM | POA: Diagnosis not present

## 2024-05-12 DIAGNOSIS — M25511 Pain in right shoulder: Secondary | ICD-10-CM | POA: Diagnosis not present

## 2024-07-22 ENCOUNTER — Other Ambulatory Visit: Payer: Self-pay | Admitting: Endocrinology

## 2024-07-22 DIAGNOSIS — Z0000 Encounter for general adult medical examination without abnormal findings: Secondary | ICD-10-CM

## 2024-08-01 DIAGNOSIS — Z23 Encounter for immunization: Secondary | ICD-10-CM | POA: Diagnosis not present

## 2024-09-03 ENCOUNTER — Ambulatory Visit
Admission: RE | Admit: 2024-09-03 | Discharge: 2024-09-03 | Disposition: A | Source: Ambulatory Visit | Attending: Endocrinology | Admitting: Endocrinology

## 2024-09-03 ENCOUNTER — Encounter: Payer: Self-pay | Admitting: Obstetrics and Gynecology

## 2024-09-03 ENCOUNTER — Other Ambulatory Visit: Payer: Self-pay | Admitting: Obstetrics and Gynecology

## 2024-09-03 ENCOUNTER — Ambulatory Visit (INDEPENDENT_AMBULATORY_CARE_PROVIDER_SITE_OTHER): Admitting: Obstetrics and Gynecology

## 2024-09-03 ENCOUNTER — Ambulatory Visit: Payer: Self-pay | Admitting: Obstetrics and Gynecology

## 2024-09-03 VITALS — BP 110/68 | HR 76 | Wt 118.4 lb

## 2024-09-03 DIAGNOSIS — N898 Other specified noninflammatory disorders of vagina: Secondary | ICD-10-CM

## 2024-09-03 DIAGNOSIS — R350 Frequency of micturition: Secondary | ICD-10-CM | POA: Diagnosis not present

## 2024-09-03 DIAGNOSIS — Z0000 Encounter for general adult medical examination without abnormal findings: Secondary | ICD-10-CM

## 2024-09-03 DIAGNOSIS — Z1231 Encounter for screening mammogram for malignant neoplasm of breast: Secondary | ICD-10-CM | POA: Diagnosis not present

## 2024-09-03 DIAGNOSIS — Z8619 Personal history of other infectious and parasitic diseases: Secondary | ICD-10-CM | POA: Diagnosis not present

## 2024-09-03 LAB — WET PREP FOR TRICH, YEAST, CLUE

## 2024-09-03 MED ORDER — FLUCONAZOLE 150 MG PO TABS: 150.0000 mg | ORAL_TABLET | Freq: Once | ORAL | 0 refills | Status: AC

## 2024-09-03 MED ORDER — VALACYCLOVIR HCL 500 MG PO TABS: ORAL_TABLET | ORAL | 1 refills | Status: AC

## 2024-09-03 NOTE — Progress Notes (Signed)
   Acute Office Visit  Subjective:    Patient ID: Shelley Drake, female    DOB: Jul 27, 1956, 68 y.o.   MRN: [id number]   HPI 68 y.o. presents today for Gynecologic Exam (Tan discharge with a lite odor the last 3 days. No burning no itching, some urine frequency. ) . Using vaginfem twice a week and this is helping She is newly married  Patient's last menstrual period was 70/10/2006.    Review of Systems     Objective:    OBGyn Exam  BP 110/68   Pulse 76   Wt 118 lb 6.4 oz (53.7 kg)   LMP 12/28/2006   SpO2 98%   BMI 21.14 kg/m  Wt Readings from Last 3 Encounters:  09/03/24 118 lb 6.4 oz (53.7 kg)  01/23/24 111 lb (50.3 kg)  01/22/23 112 lb (50.8 kg)       UA trace blood SVE: normal discharge, no lesions nu swab sent  Assessment & Plan:  Vaginal discharge Nuswab sent Patient to send her Dxa results here next week to review 3. Continue vagifem  Almarie MARLA Carpen

## 2024-09-05 LAB — URINE CULTURE
MICRO NUMBER:: 17200462
Result:: NO GROWTH
SPECIMEN QUALITY:: ADEQUATE

## 2024-09-05 LAB — URINALYSIS, COMPLETE W/RFL CULTURE
Bacteria, UA: NONE SEEN /HPF
Bilirubin Urine: NEGATIVE
Glucose, UA: NEGATIVE
Hyaline Cast: NONE SEEN /LPF
Ketones, ur: NEGATIVE
Leukocyte Esterase: NEGATIVE
Nitrites, Initial: NEGATIVE
Protein, ur: NEGATIVE
Specific Gravity, Urine: 1.02 (ref 1.001–1.035)
pH: 5.5 (ref 5.0–8.0)

## 2024-09-05 LAB — CULTURE INDICATED

## 2025-01-25 ENCOUNTER — Encounter: Admitting: Obstetrics and Gynecology
